# Patient Record
Sex: Female | Born: 1965 | Race: White | Hispanic: No | Marital: Single | Smoking: Current every day smoker
Health system: Southern US, Community
[De-identification: ages and names within clinical notes are randomized; demographics above are authoritative.]

## PROBLEM LIST (undated history)

## (undated) DIAGNOSIS — E7800 Pure hypercholesterolemia, unspecified: Secondary | ICD-10-CM

## (undated) DIAGNOSIS — I10 Essential (primary) hypertension: Secondary | ICD-10-CM

## (undated) DIAGNOSIS — D680 Von Willebrand's disease: Secondary | ICD-10-CM

## (undated) DIAGNOSIS — N289 Disorder of kidney and ureter, unspecified: Secondary | ICD-10-CM

## (undated) HISTORY — PX: CARPAL TUNNEL RELEASE: SHX101

## (undated) HISTORY — PX: BLADDER SURGERY: SHX569

## (undated) HISTORY — PX: ECTOPIC PREGNANCY SURGERY: SHX613

## (undated) HISTORY — PX: ABDOMINAL HYSTERECTOMY: SHX81

---

## 2005-02-22 ENCOUNTER — Ambulatory Visit: Payer: Self-pay

## 2005-09-06 ENCOUNTER — Ambulatory Visit: Payer: Self-pay | Admitting: Internal Medicine

## 2005-09-15 ENCOUNTER — Emergency Department: Payer: Self-pay | Admitting: Emergency Medicine

## 2006-03-20 ENCOUNTER — Ambulatory Visit: Payer: Self-pay | Admitting: Family Medicine

## 2007-03-29 ENCOUNTER — Ambulatory Visit: Payer: Self-pay

## 2008-04-01 ENCOUNTER — Ambulatory Visit: Payer: Self-pay | Admitting: Family Medicine

## 2009-04-07 ENCOUNTER — Ambulatory Visit: Payer: Self-pay | Admitting: Family Medicine

## 2010-04-08 ENCOUNTER — Ambulatory Visit: Payer: Self-pay | Admitting: Family Medicine

## 2010-06-08 ENCOUNTER — Ambulatory Visit: Payer: Self-pay | Admitting: Podiatry

## 2011-01-21 ENCOUNTER — Ambulatory Visit: Payer: Self-pay | Admitting: Adult Health

## 2011-08-30 ENCOUNTER — Ambulatory Visit: Payer: Self-pay

## 2012-02-13 IMAGING — [facility] SCRN MAMMO DIGITAL /CAD
1 series · 4 of 4 positions shown · non-contrast
Comparison: none

REASON FOR EXAM: SCR
COMMENTS:

[Series 265: R CC · right · 4 of 4 slices shown]
[im 1/4]
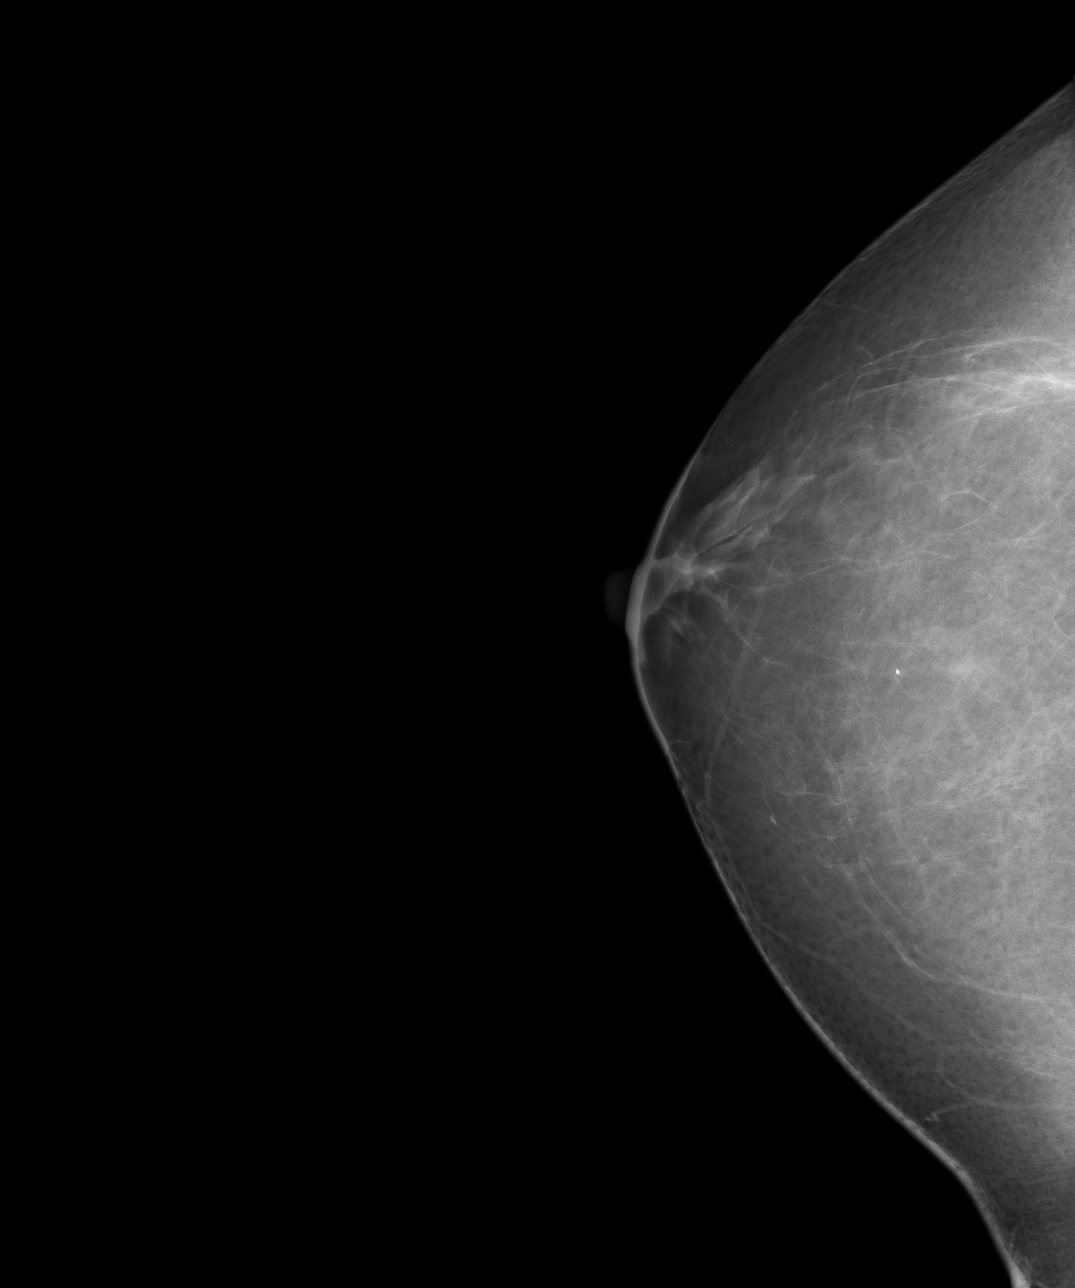
[im 2/4]
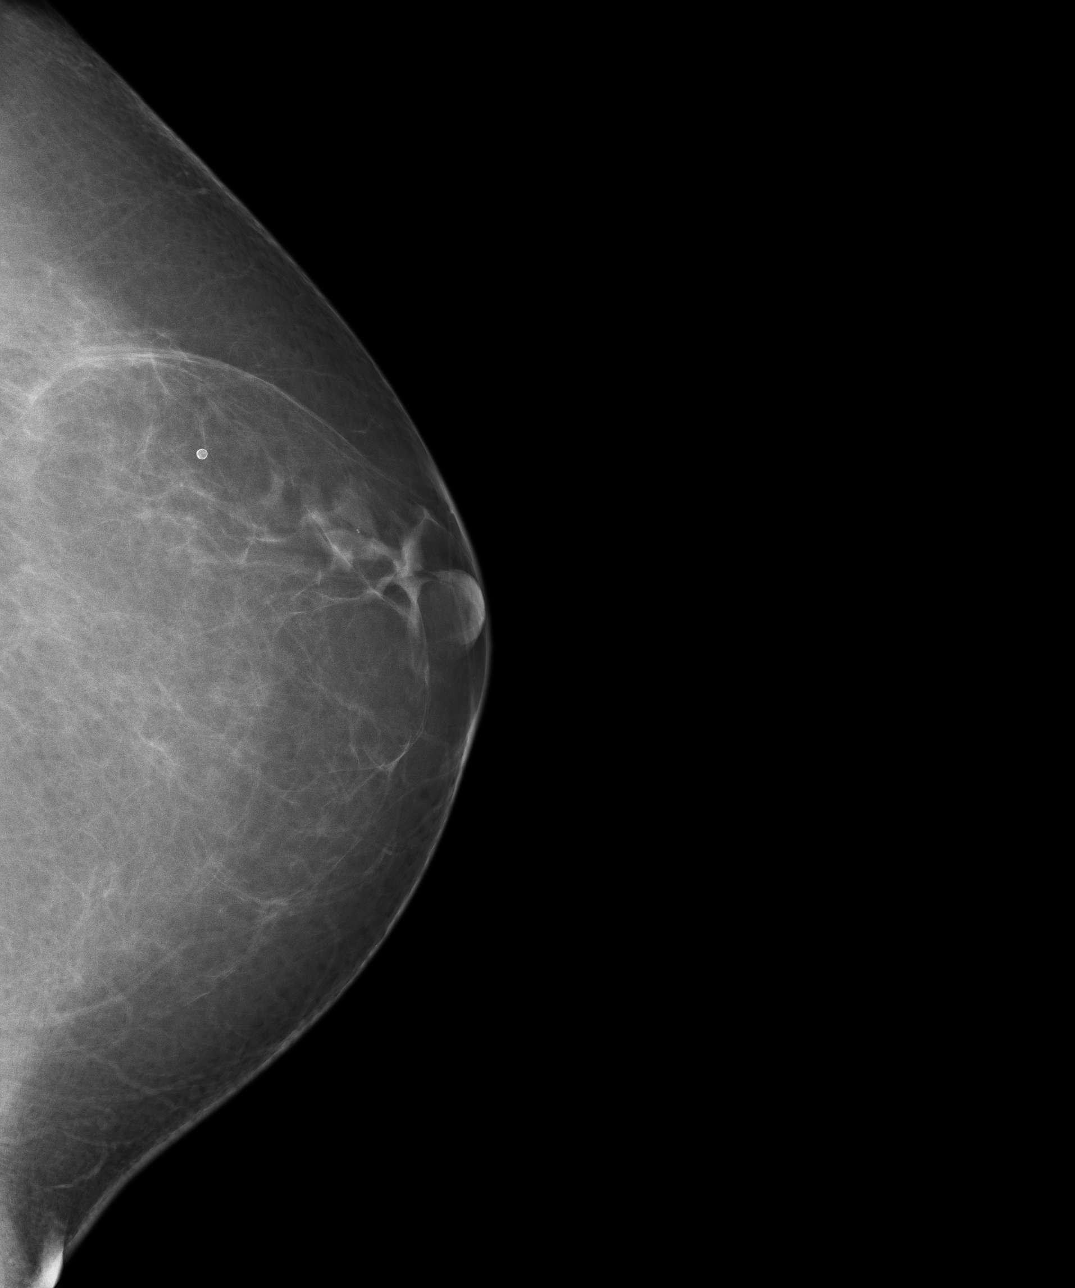
[im 3/4]
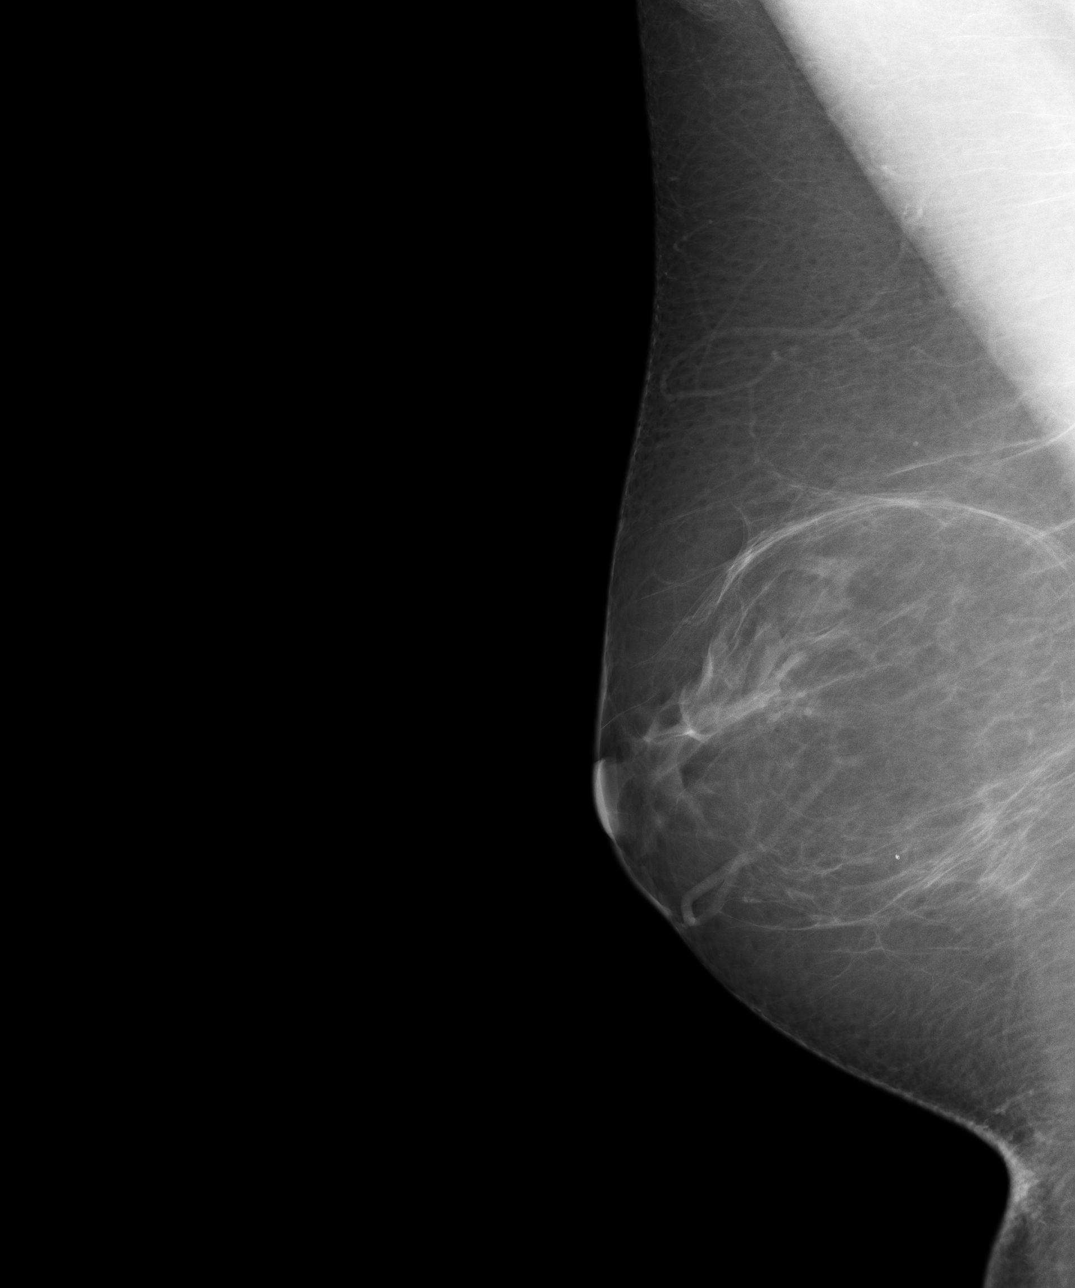
[im 4/4]
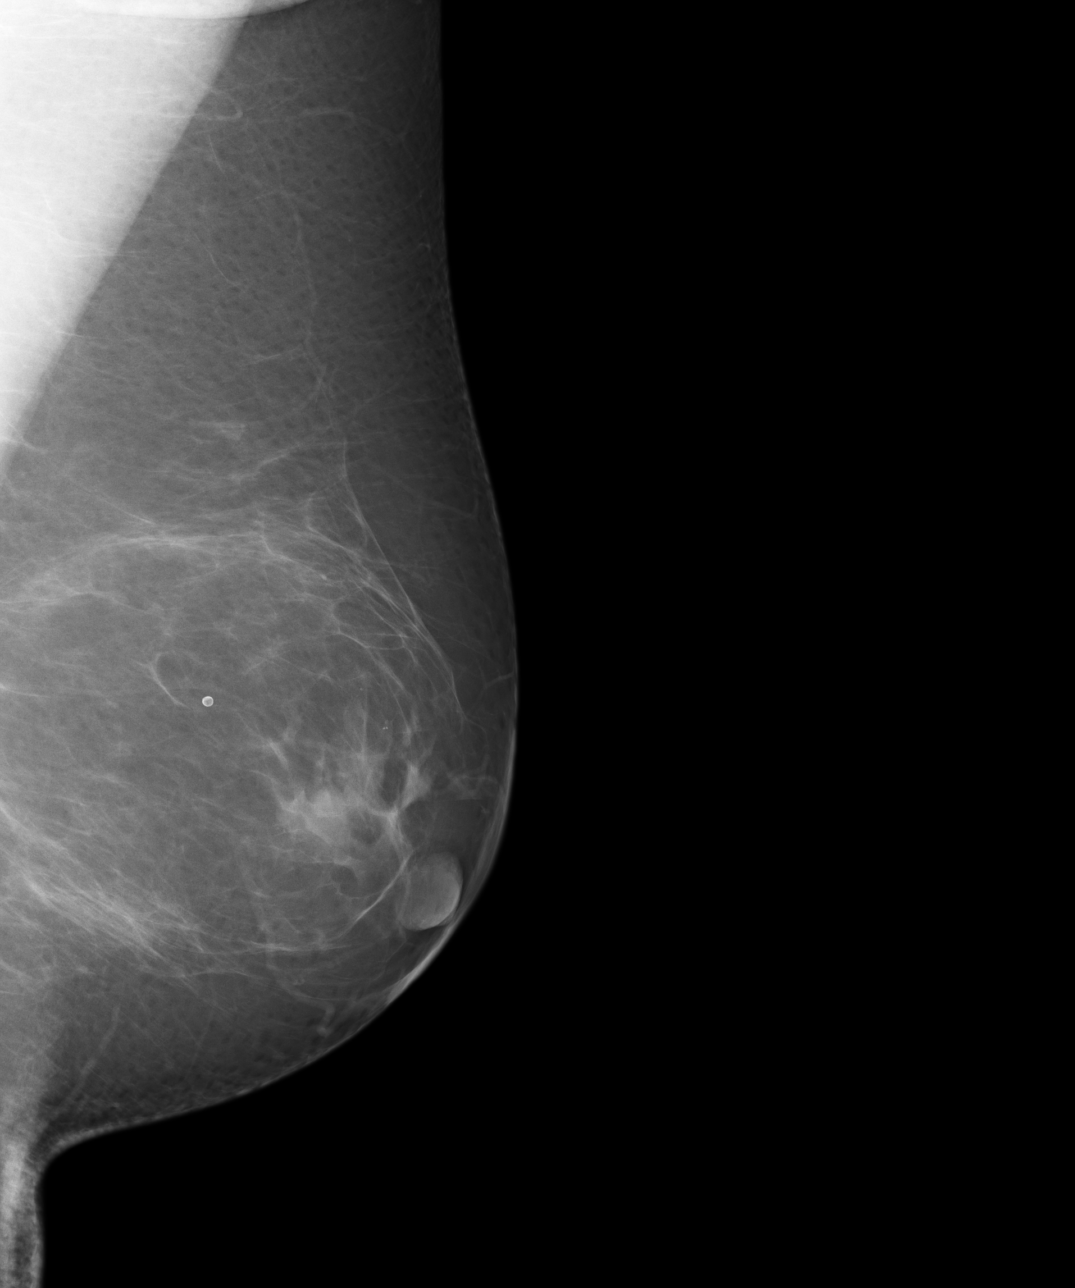

[4 of 4 positions shown; findings below may reference images not displayed]

PROCEDURE:     MAM - [facility] SCRN MAMMO DIGITAL /CAD  - April 08, 2010  [DATE]

RESULT:       Comparison is made to analog images dated 09/27/01 and to
digital exams of 04/07/09, 04/01/08 and 10/22/02.

The breasts exhibit a mild to moderately dense parenchymal pattern with some
benign calcifications bilaterally.  There is no developing density, dominant
mass or malignant calcification.
IMPRESSION: 1.     Stable benign-appearing bilateral mammogram.
2.     BI-RADS:  Category 2- Benign Finding.
3.     Please continue to encourage annual mammographic follow up.

A negative mammogram report does not preclude biopsy or other evaluation of
a clinically palpable or otherwise suspicious mass or lesion. Breast cancer
may not be detected by mammography in up to 10% of cases.

## 2013-02-27 ENCOUNTER — Ambulatory Visit: Payer: Self-pay

## 2013-03-21 ENCOUNTER — Ambulatory Visit: Payer: Self-pay

## 2015-02-21 ENCOUNTER — Ambulatory Visit: Payer: Self-pay | Admitting: Family Medicine

## 2017-10-05 ENCOUNTER — Encounter: Payer: Self-pay | Admitting: Physical Therapy

## 2017-10-05 ENCOUNTER — Ambulatory Visit: Payer: Medicaid Other | Attending: Family Medicine | Admitting: Physical Therapy

## 2017-10-05 DIAGNOSIS — M6281 Muscle weakness (generalized): Secondary | ICD-10-CM | POA: Diagnosis present

## 2017-10-05 DIAGNOSIS — G8929 Other chronic pain: Secondary | ICD-10-CM

## 2017-10-05 DIAGNOSIS — M25612 Stiffness of left shoulder, not elsewhere classified: Secondary | ICD-10-CM | POA: Insufficient documentation

## 2017-10-05 DIAGNOSIS — M25611 Stiffness of right shoulder, not elsewhere classified: Secondary | ICD-10-CM | POA: Diagnosis present

## 2017-10-05 DIAGNOSIS — M25512 Pain in left shoulder: Secondary | ICD-10-CM | POA: Diagnosis present

## 2017-10-05 DIAGNOSIS — M25511 Pain in right shoulder: Secondary | ICD-10-CM | POA: Diagnosis present

## 2017-10-05 NOTE — Therapy (Signed)
[facility] [address], [place], [address] Phone: [phone]   Fax:  [phone]  Physical Therapy Evaluation  Patient Details  Name: Thaily Hackworth MRN: [id number] Date of Birth: [birth year] Referring Provider: Dr. Marolyn Haller  Encounter Date: 10/05/2017      PT End of Session - 10/05/17 1723    Visit Number 1   Number of Visits 4   Date for PT Re-Evaluation 11/02/17   Authorization Type Medicaid   PT Start Time 1529   PT Stop Time 1619   PT Time Calculation (min) 50 min   Activity Tolerance Patient tolerated treatment well;Patient limited by pain   Behavior During Therapy [facility] for tasks assessed/performed      History reviewed. No pertinent past medical history.  History reviewed. No pertinent surgical history.  There were no vitals filed for this visit.       Subjective Assessment - 10/05/17 1525    Subjective Pt has no pain at rest today. With elevation and lifting pain gets up to 3/10 in last 24 hours.    Pertinent History Pt presents to therapy with L shoulder pain. MRI on 10/26 shows small full thickness tears of the L supraspinatus and subscapularis. MRI also identified L biceps tendinosis/tenosynovitis, subdeltoid bursitis, and AC joint OA. Left shoulder has been worse since recent fall. Pt reports that she fell in the bathroom in past onto L side on her elbow. Heavy lifting can cause pt to have pain and reduced UE function for 2-3 weeks at a time. Pt cannot reach the top of her head to perform hygiene; she props her elbows against the wall to get increased shoulder elevation. Pt is considering surgery for both shoulders.   Limitations House hold activities;Lifting   Diagnostic tests Small full-thickness anterior supraspinatus tear, left shoulder, 8 mm AP by 7 mm transverse. Small full-thickness subscapularis tear. Normal rotator cuff muscle bulk. Mild intra-articular long head of biceps tendinosis.  Bicipital tenosynovitis. Mild subdeltoid bursitis. Cystic structure anterior to the proximal humerus medial to the biceps tendon is most consistent with a ganglion cyst. Moderate acromioclavicular joint osteoarthrosis with associated subchondral bone marrow edema. There is asymmetrically increased edema in the distal clavicle suggesting the possibility of superimposed repetitive stress.     Patient Stated Goals Decreased pain, to ba able to reach up to wash my hair, reach into cabinets, etc   Currently in Pain? Yes   Pain Score 3    Pain Location Shoulder   Pain Orientation Right;Left   Pain Descriptors / Indicators Aching;Tightness   Pain Type Chronic pain   Pain Onset More than a month ago   Pain Frequency Intermittent   Aggravating Factors  Heavy Lifting, shoulder elevation   Pain Relieving Factors Rest   Effect of Pain on Daily Activities Decreased UE function for day/weeks at a time            [facility] PT Assessment - 10/05/17 0001      Assessment   Medical Diagnosis Bilateral shoulder pain   Referring Provider Dr. Marolyn Haller   Onset Date/Surgical Date --  many years   Hand Dominance Right   Next MD Visit --  None scheduled   Prior Therapy Yes following a fall about 12 years ago.     Precautions   Precautions None     Restrictions   Weight Bearing Restrictions No     Balance Screen   Has the patient fallen in the past 6 months  Yes   How many times? 1   Has the patient had a decrease in activity level because of a fear of falling?  No   Is the patient reluctant to leave their home because of a fear of falling?  No      PROM/AROM:  Upper Extremity: Shoulder: IR: R to T12, L to L5 with hand behind back. Flexion: R 91 L 93 deg AROM, R 113 L 101 deg PROM, Abduction: Same as flexion bilaterally, ER WFL bilaterally. Elbow AROM WFL bilaterally  Cervical:  Rotation R 70 deg, L 75 deg,     Lower extremity: WFL   STRENGTH:  Graded on a 0-5 scale Muscle Group Left Right   Shoulder Flex 3+ pain 4  Shoulder Abd 3+ pain 4-  Shoulder  IR/ER 4- 4-  Elbow Flex 4 4+  Elbow Ext 4- 4-  Grip 35# 24# (trigger finger)  Gross Lower Extremity [facility]  Shoulder flexion, abduction rated within pt's available range.  Sensory:  Light touch: No abnormality reported      Coordination:   No gross impairment within limited range  Observations:   Posture: Forward shoulders, elevated/abducted scapulae, slouched posture, forward head.   BALANCE: Static Standing Balance  Normal Able to maintain standing balance against maximal resistance   Good Able to maintain standing balance against moderate resistance X  Good-/Fair+ Able to maintain standing balance against minimal resistance   Fair Able to stand unsupported without UE support and without LOB for 1-2 min   Fair- Requires Min A and UE support to maintain standing without loss of balance   Poor+ Requires mod A and UE support to maintain standing without loss of balance   Poor Requires max A and UE support to maintain standing balance without loss     Standing Dynamic Balance  Normal Stand independently unsupported, able to weight shift and cross midline maximally   Good Stand independently unsupported, able to weight shift and cross midline moderately X  Good-/Fair+ Stand independently unsupported, able to weight shift across midline minimally   Fair Stand independently unsupported, weight shift, and reach ipsilaterally, loss of balance when crossing midline   Poor+ Able to stand with Min A and reach ipsilaterally, unable to weight shift   Poor+ Able to stand with Mod A and minimally reach ipsilaterally, unable to cross midline.        SPECIAL TESTS:     Hawkins-Kennedy : (+) bilaterally  Neer: Painful bilaterally, test unreliable due to pt having pain at end range  flexion  Full can: (- for pain) weak bilaterally  Resisted ER: Weak without pain  FUNCTIONAL MOBILITY:   Transfers: Pt abe to stand with  use of UE with no LOB   Gait: Pt with decreased stride length and gait speed.   OUTCOME MEASURES: TEST Outcome Interpretation  Quick Dash   58% Moderate Disability      Objective measurements completed on examination: See above findings.    Treatment:  Ther-ex: Sitting: Shoulder pulley flexion/abduction x 10 each UE is direction with 10 sec hold in pain free range Standing:  Wand flexion, abduction, Scapular retraction, upper trap stretch  instructed   All above exercises added to HEP with handout, see pt instructions  Pt already performing: Wall ladder, shoulder roll, modified pendulum,         PT Long Term Goals - 10/05/17 1747      PT LONG TERM GOAL #1   Title  Patient will decrease Quick DASH  score by > 8 points demonstrating reduced self-reported upper extremity disability.   Baseline 58%   Time 4   Period Weeks   Status New   Target Date 11/02/17     PT LONG TERM GOAL #2   Title Patient will report a worst pain of 1/10 on VAS in bilateral shoulders to improve tolerance with ADLs and reduced symptoms with lifting and hygiene activities.    Baseline 3/10   Time 4   Period Weeks   Status New   Target Date 11/02/17     PT LONG TERM GOAL #3   Title Patient will increase BUE gross strength to 4+/5 as to improve functional strength for independence with ADLs/IADLs, carrying, lifting, hygiene, and work activities.   Baseline Grossly 3+ to 4-/5 see eval 10/05/17   Time 4   Period Weeks   Status New   Target Date 11/02/17     PT LONG TERM GOAL #4   Title Pt. able to demonstrate proper lifting/ carrying mechanics with no increase in B shoulder pain to improve househould tasks.   Baseline pain limiting with lifting/ carrying tasks.    Time 4   Period Weeks   Status New   Target Date 11/02/17     PT LONG TERM GOAL #5   Title Pt will improve shoulder internal rotation AROM in order to be able to reach behind back to T3,4 in order to dress independently and  reach pockets.    Baseline IR: R to T12, L to L5   Time 4   Period Weeks   Status New   Target Date 11/02/17                Plan - 10/05/17 1724    Clinical Impression Statement 51 y/o female presents with B chronic shoulder pain with supraspinatus full thickness tear bilaterally (see MRI results). R shoulder has been painful for years, L shoulder has been worse since fall on 08/05/17. Pt assessed and found to have decreased strength in BUEs with decreased AROM in elevation consistent with supraspinatus tear. Pt has moderate UE disability based on Quick Dash (58%). HEP initiated. Pt is seriously considering surgery for R shoulder, and possible for L shoulder after. Pt educated on benefits of PT with or without surgical option.    History and Personal Factors relevant to plan of care: (+) age (-) chronic injury, full tear   Clinical Presentation Evolving   Clinical Presentation due to: Pain/disfunction worsening    Clinical Decision Making Moderate   Rehab Potential Fair   Clinical Impairments Affecting Rehab Potential Full thickness RC tear, chronic postural deficit   PT Frequency 2x / week   PT Duration 4 weeks   PT Treatment/Interventions ADLs/Self Care Home Management;Aquatic Therapy;Biofeedback;Cryotherapy;Moist Heat;Therapeutic activities;Therapeutic exercise;Neuromuscular re-education;Manual techniques;Patient/family education;Passive range of motion;Dry needling;Energy conservation;Taping   PT Next Visit Plan Further assess cervical AROM, STM to upper traps, scap retraction, postural education, re-assess HEP   PT Home Exercise Plan Pt already performing: Wall ladder, shoulder roll, modified pendulum; shoulder pulley flexion/abduction, wand flexion, abduction, scapular retraction, upper trap stretch   Consulted and Agree with Plan of Care Patient      Patient will benefit from skilled therapeutic intervention in order to improve the following deficits and impairments:  Decreased  activity tolerance, Decreased range of motion, Decreased strength, Hypomobility, Increased fascial restricitons, Impaired flexibility, Impaired perceived functional ability, Increased muscle spasms, Impaired tone, Impaired UE functional use, Postural dysfunction, Improper body mechanics, Pain  Visit  Diagnosis: Chronic left shoulder pain  Chronic right shoulder pain  Stiffness of left shoulder, not elsewhere classified  Stiffness of right shoulder, not elsewhere classified  Muscle weakness (generalized)     Problem List There are no active problems to display for this patient.  Cassell Smiles, SPT Cammie Mcgee, PT, DPT # [id number] 10/06/2017, 7:51 AM  [facility] [address] [place], [place], [address] Phone: [phone]   Fax:  [phone]  Name: Gloriann Riede MRN: [id number] Date of Birth: 10-02-1966

## 2017-10-06 ENCOUNTER — Encounter: Payer: Self-pay | Admitting: Physical Therapy

## 2017-10-12 ENCOUNTER — Encounter: Payer: Self-pay | Admitting: Physical Therapy

## 2017-10-12 ENCOUNTER — Ambulatory Visit: Payer: Medicaid Other | Admitting: Physical Therapy

## 2017-10-12 DIAGNOSIS — M25512 Pain in left shoulder: Secondary | ICD-10-CM | POA: Diagnosis not present

## 2017-10-12 DIAGNOSIS — G8929 Other chronic pain: Secondary | ICD-10-CM

## 2017-10-12 DIAGNOSIS — M25511 Pain in right shoulder: Secondary | ICD-10-CM

## 2017-10-12 DIAGNOSIS — M6281 Muscle weakness (generalized): Secondary | ICD-10-CM

## 2017-10-12 DIAGNOSIS — M25612 Stiffness of left shoulder, not elsewhere classified: Secondary | ICD-10-CM

## 2017-10-12 DIAGNOSIS — M25611 Stiffness of right shoulder, not elsewhere classified: Secondary | ICD-10-CM

## 2017-10-12 NOTE — Therapy (Signed)
[place] [facility] [address] [place], [place], [address] Phone: [phone]   Fax:  [phone]  Physical Therapy Treatment  Patient Details  Name: Beth Ayala MRN: [id number] Date of Birth: [birth year] Referring Provider: Dr. Marolyn HallerHeffington   Encounter Date: 10/12/2017  PT End of Session - 10/12/17 1748    Visit Number  2    Number of Visits  4    Date for PT Re-Evaluation  11/02/17    Authorization Type  Medicaid    PT Start Time  1544    PT Stop Time  1633    PT Time Calculation (min)  49 min    Activity Tolerance  Patient tolerated treatment well;No increased pain    Behavior During Therapy  [facility] for tasks assessed/performed       History reviewed. No pertinent past medical history.  History reviewed. No pertinent surgical history.  There were no vitals filed for this visit.  Subjective Assessment - 10/12/17 1544    Subjective  Pt has no pain today; she is performing HEP everyday without any reported issues.     Pertinent History  Pt presents to therapy with L shoulder pain. MRI on 10/26 shows small full thickness tears of the L supraspinatus and subscapularis. MRI also identified L biceps tendinosis/tenosynovitis, subdeltoid bursitis, and AC joint OA. Left shoulder has been worse since recent fall. Pt reports that she fell in the bathroom in past onto L side on her elbow. Heavy lifting can cause pt to have pain and reduced UE function for 2-3 weeks at a time. Pt cannot reach the top of her head to perform hygiene; she props her elbows against the wall to get increased shoulder elevation. Pt is considering surgery for both shoulders.    Limitations  House hold activities;Lifting    Diagnostic tests  Small full-thickness anterior supraspinatus tear, left shoulder, 8 mm AP by 7 mm transverse. Small full-thickness subscapularis tear. Normal rotator cuff muscle bulk. Mild intra-articular long head of biceps tendinosis. Bicipital  tenosynovitis. Mild subdeltoid bursitis. Cystic structure anterior to the proximal humerus medial to the biceps tendon is most consistent with a ganglion cyst. Moderate acromioclavicular joint osteoarthrosis with associated subchondral bone marrow edema. There is asymmetrically increased edema in the distal clavicle suggesting the possibility of superimposed repetitive stress.      Patient Stated Goals  Decreased pain, to ba able to reach up to wash my hair, reach into cabinets, etc    Currently in Pain?  No/denies    Pain Onset  More than a month ago         Treatment:  Ther-ex: Sitting:    Shoulder pulley flexion/abduction x 10 each UE in each direction with 10 sec hold in       pain free range Standing:    Doorway pec stretch 2 x 30 sec    Row with GTB using handle attachment x 20 with 3 sec hold (no pain)   Instructed pt in tendon gliding exercise series for carpal tunnel with handout     Cervical retraction and upper trap stretch instructed and added to HEP; pt able to     perform with good technique      Manual therapy:     STM to upper traps, cervical paraspinals, and intrascapular musculature x 8-10 min      PT Education - 10/12/17 1643    Education provided  Yes    Education Details  HEP advanced  Person(s) Educated  Patient    Methods  Demonstration;Explanation;Tactile cues;Verbal cues;Handout    Comprehension  Verbal cues required;Returned demonstration;Verbalized understanding;Tactile cues required          PT Long Term Goals - 10/05/17 1747      PT LONG TERM GOAL #1   Title   Patient will decrease Quick DASH score by > 8 points demonstrating reduced self-reported upper extremity disability.    Baseline  58%    Time  4    Period  Weeks    Status  New    Target Date  11/02/17      PT LONG TERM GOAL #2   Title  Patient will report a worst pain of 1/10 on VAS in bilateral shoulders to improve tolerance with ADLs and reduced symptoms with lifting and hygiene  activities.     Baseline  3/10    Time  4    Period  Weeks    Status  New    Target Date  11/02/17      PT LONG TERM GOAL #3   Title  Patient will increase BUE gross strength to 4+/5 as to improve functional strength for independence with ADLs/IADLs, carrying, lifting, hygiene, and work activities.    Baseline  Grossly 3+ to 4-/5 see eval 10/05/17    Time  4    Period  Weeks    Status  New    Target Date  11/02/17      PT LONG TERM GOAL #4   Title  Pt. able to demonstrate proper lifting/ carrying mechanics with no increase in B shoulder pain to improve househould tasks.    Baseline  pain limiting with lifting/ carrying tasks.     Time  4    Period  Weeks    Status  New    Target Date  11/02/17      PT LONG TERM GOAL #5   Title  Pt will improve shoulder internal rotation AROM in order to be able to reach behind back to T3,4 in order to dress independently and reach pockets.     Baseline  IR: R to T12, L to L5    Time  4    Period  Weeks    Status  New    Target Date  11/02/17            Plan - 10/12/17 1755    Clinical Impression Statement  Pt returns to therapy with no resting symptoms. She was able to perform her HEP with good technique requiring min cueing. HEP updated. Pt continues to have pain in R hand with possible trigger finger in 4th digit on R hand and weakness in bilateral hands (pt reports this is due to carpal tunnel bilaterally, which she has been treated for in the past).    Clinical Presentation  Evolving    Clinical Decision Making  Moderate    Rehab Potential  Fair    Clinical Impairments Affecting Rehab Potential  Full thickness RC tear, chronic postural deficit    PT Frequency  2x / week    PT Duration  4 weeks    PT Treatment/Interventions  ADLs/Self Care Home Management;Aquatic Therapy;Biofeedback;Cryotherapy;Moist Heat;Therapeutic activities;Therapeutic exercise;Neuromuscular re-education;Manual techniques;Patient/family education;Passive range of  motion;Dry needling;Energy conservation;Taping    PT Next Visit Plan  Further assess cervical AROM, STM to upper traps, Rotator cuff strengthening/stretching    PT Home Exercise Plan  Scapular retraction GTB, cervical retraction Wall ladder, shoulder roll, modified pendulum; shoulder  pulley flexion/abduction, wand flexion, abduction, scapular retraction, upper trap stretch    Consulted and Agree with Plan of Care  Patient       Patient will benefit from skilled therapeutic intervention in order to improve the following deficits and impairments:  Decreased activity tolerance, Decreased range of motion, Decreased strength, Hypomobility, Increased fascial restricitons, Impaired flexibility, Impaired perceived functional ability, Increased muscle spasms, Impaired tone, Impaired UE functional use, Postural dysfunction, Improper body mechanics, Pain  Visit Diagnosis: Chronic left shoulder pain  Chronic right shoulder pain  Stiffness of left shoulder, not elsewhere classified  Stiffness of right shoulder, not elsewhere classified  Muscle weakness (generalized)     Problem List There are no active problems to display for this patient.  Addysin Porco Clydene Laming, SPT Cammie Mcgee, PT, DPT # [id number] 10/13/2017, 2:23 PM  [facility] [address] [place], [place], [address] Phone: [phone]   Fax:  [phone]  Name: Beth Ayala MRN: [id number] Date of Birth: 08/15/1966

## 2017-10-19 ENCOUNTER — Ambulatory Visit: Payer: Medicaid Other | Admitting: Physical Therapy

## 2017-10-19 ENCOUNTER — Encounter: Payer: Self-pay | Admitting: Physical Therapy

## 2017-10-19 DIAGNOSIS — M25611 Stiffness of right shoulder, not elsewhere classified: Secondary | ICD-10-CM

## 2017-10-19 DIAGNOSIS — G8929 Other chronic pain: Secondary | ICD-10-CM

## 2017-10-19 DIAGNOSIS — M25612 Stiffness of left shoulder, not elsewhere classified: Secondary | ICD-10-CM

## 2017-10-19 DIAGNOSIS — M6281 Muscle weakness (generalized): Secondary | ICD-10-CM

## 2017-10-19 DIAGNOSIS — M25511 Pain in right shoulder: Secondary | ICD-10-CM

## 2017-10-19 DIAGNOSIS — M25512 Pain in left shoulder: Principal | ICD-10-CM

## 2017-10-19 NOTE — Therapy (Signed)
[facility] [address] [place], [place], [address] Phone: [phone]   Fax:  [phone]  Physical Therapy Treatment  Patient Details  Name: Beth Ayala MRN: [id number] Date of Birth: Sep 15, 1966 Referring Provider: Dr. Marolyn HallerHeffington   Encounter Date: 10/19/2017  PT End of Session - 10/19/17 1739    Visit Number  3    Number of Visits  4    Date for PT Re-Evaluation  11/02/17    Authorization Type  Medicaid    PT Start Time  1536    PT Stop Time  1640    PT Time Calculation (min)  64 min    Activity Tolerance  Patient tolerated treatment well;No increased pain    Behavior During Therapy  [facility] for tasks assessed/performed       History reviewed. No pertinent past medical history.  History reviewed. No pertinent surgical history.  There were no vitals filed for this visit.  Subjective Assessment - 10/19/17 1541    Subjective  Pt reports she has had some L lateral neck soreness after performing stretches. No pain currently    Pertinent History  Pt presents to therapy with L shoulder pain. MRI on 10/26 shows small full thickness tears of the L supraspinatus and subscapularis. MRI also identified L biceps tendinosis/tenosynovitis, subdeltoid bursitis, and AC joint OA. Left shoulder has been worse since recent fall. Pt reports that she fell in the bathroom in past onto L side on her elbow. Heavy lifting can cause pt to have pain and reduced UE function for 2-3 weeks at a time. Pt cannot reach the top of her head to perform hygiene; she props her elbows against the wall to get increased shoulder elevation. Pt is considering surgery for both shoulders.    Limitations  House hold activities;Lifting    Diagnostic tests  Small full-thickness anterior supraspinatus tear, left shoulder, 8 mm AP by 7 mm transverse. Small full-thickness subscapularis tear. Normal rotator cuff muscle bulk. Mild intra-articular long head of biceps  tendinosis. Bicipital tenosynovitis. Mild subdeltoid bursitis. Cystic structure anterior to the proximal humerus medial to the biceps tendon is most consistent with a ganglion cyst. Moderate acromioclavicular joint osteoarthrosis with associated subchondral bone marrow edema. There is asymmetrically increased edema in the distal clavicle suggesting the possibility of superimposed repetitive stress.      Patient Stated Goals  Decreased pain, to ba able to reach up to wash my hair, reach into cabinets, etc    Currently in Pain?  No/denies    Pain Onset  More than a month ago       Treatment:  Warm-up on UE bike x 3 min each direction (un-billed)  Ther-ex: Sitting:        Lat pull down resisted with GTB x 20 reps with active assisted shoulder       abduction stretch (adapted with t-band handles due to limited grip strength) Standing:   Shoulder IR/ER resisted with YTB x 20 each with each UE (added to HEP)           Doorway pec stretch 2 x 30 sec    Instructed pt in corner pec stretch 2 x 30 sec    Shoulder stretch at // bars with straight arm (eccentric shoulder extension     Stretch) 4 x 30 sec Manual therapy: Supine:          Cervical stretches for upper trap, levator, and scalenes using muscle energy     technique  for contract-relax stretch, 5 sec contract and 20 sec relax x 4      iterations each side   Re-instructed cervical retraction     Cervical distraction x 4-6 min throughout manual therapy   Suboccipital release x 3-4 min   HEP updated with lat pull down with GTB, B shoulder ER with YTB, shoulder IR with YTB.      PT Long Term Goals - 10/05/17 1747      PT LONG TERM GOAL #1   Title   Patient will decrease Quick DASH score by > 8 points demonstrating reduced self-reported upper extremity disability.    Baseline  58%    Time  4    Period  Weeks    Status  New    Target Date  11/02/17      PT LONG TERM GOAL #2   Title  Patient will report a worst pain of 1/10 on VAS  in bilateral shoulders to improve tolerance with ADLs and reduced symptoms with lifting and hygiene activities.     Baseline  3/10    Time  4    Period  Weeks    Status  New    Target Date  11/02/17      PT LONG TERM GOAL #3   Title  Patient will increase BUE gross strength to 4+/5 as to improve functional strength for independence with ADLs/IADLs, carrying, lifting, hygiene, and work activities.    Baseline  Grossly 3+ to 4-/5 see eval 10/05/17    Time  4    Period  Weeks    Status  New    Target Date  11/02/17      PT LONG TERM GOAL #4   Title  Pt. able to demonstrate proper lifting/ carrying mechanics with no increase in B shoulder pain to improve househould tasks.    Baseline  pain limiting with lifting/ carrying tasks.     Time  4    Period  Weeks    Status  New    Target Date  11/02/17      PT LONG TERM GOAL #5   Title  Pt will improve shoulder internal rotation AROM in order to be able to reach behind back to T3,4 in order to dress independently and reach pockets.     Baseline  IR: R to T12, L to L5    Time  4    Period  Weeks    Status  New    Target Date  11/02/17            Plan - 10/19/17 1750    Clinical Impression Statement  Pt had increased pain-free shoulder AROM today. She is able to abduct shoulders passively to >140deg without pain eccentrically with T-band resited lat pull-downs. Pt is adherent to HEP and has good carryover with technique. Consider D/C next session with instructions to contact PT if she has any questions or change in status    Clinical Presentation  Evolving    Clinical Decision Making  Moderate    Rehab Potential  Fair    Clinical Impairments Affecting Rehab Potential  Full thickness RC tear, chronic postural deficit    PT Frequency  2x / week    PT Duration  4 weeks    PT Treatment/Interventions  ADLs/Self Care Home Management;Aquatic Therapy;Biofeedback;Cryotherapy;Moist Heat;Therapeutic activities;Therapeutic exercise;Neuromuscular  re-education;Manual techniques;Patient/family education;Passive range of motion;Dry needling;Energy conservation;Taping    PT Next Visit Plan  Cervical stretching, RC stretching/strengthening, shoulder PROM, advance  ther-ex, Nautilus overhead eccentrics     PT Home Exercise Plan   lat pull down with GTB, B shoulder ER with YTB, shoulder IR with YTB. Scapular retraction GTB, cervical retraction Wall ladder, shoulder roll, modified pendulum; shoulder pulley flexion/abduction, wand flexion, abduction, scapular retraction, upper trap stretch    Consulted and Agree with Plan of Care  Patient       Patient will benefit from skilled therapeutic intervention in order to improve the following deficits and impairments:  Decreased activity tolerance, Decreased range of motion, Decreased strength, Hypomobility, Increased fascial restricitons, Impaired flexibility, Impaired perceived functional ability, Increased muscle spasms, Impaired tone, Impaired UE functional use, Postural dysfunction, Improper body mechanics, Pain  Visit Diagnosis: Chronic left shoulder pain  Chronic right shoulder pain  Stiffness of left shoulder, not elsewhere classified  Stiffness of right shoulder, not elsewhere classified  Muscle weakness (generalized)     Problem List There are no active problems to display for this patient.  Swannie Milius Clydene Laming, SPT Cammie Mcgee, PT, DPT # [id number] 10/20/2017, 6:22 AM  [facility] [address] [place], [place], [address] Phone: [phone]   Fax:  [phone]  Name: Beth Ayala MRN: [id number] Date of Birth: 25-Feb-1966

## 2017-10-25 ENCOUNTER — Encounter: Payer: Medicaid Other | Admitting: Physical Therapy

## 2017-10-31 ENCOUNTER — Ambulatory Visit: Payer: Medicaid Other | Admitting: Physical Therapy

## 2017-11-01 ENCOUNTER — Ambulatory Visit: Payer: Medicaid Other | Admitting: Physical Therapy

## 2017-11-01 ENCOUNTER — Encounter: Payer: Self-pay | Admitting: Physical Therapy

## 2017-11-01 DIAGNOSIS — M6281 Muscle weakness (generalized): Secondary | ICD-10-CM

## 2017-11-01 DIAGNOSIS — M25511 Pain in right shoulder: Secondary | ICD-10-CM

## 2017-11-01 DIAGNOSIS — G8929 Other chronic pain: Secondary | ICD-10-CM

## 2017-11-01 DIAGNOSIS — M25512 Pain in left shoulder: Secondary | ICD-10-CM | POA: Diagnosis not present

## 2017-11-01 DIAGNOSIS — M25611 Stiffness of right shoulder, not elsewhere classified: Secondary | ICD-10-CM

## 2017-11-01 DIAGNOSIS — M25612 Stiffness of left shoulder, not elsewhere classified: Secondary | ICD-10-CM

## 2017-11-01 NOTE — Therapy (Addendum)
[place] [address] [place], [place], [address] Phone: [phone]   Fax:  [phone]  Physical Therapy Treatment  Patient Details  Name: Beth Ayala MRN: [id number] Date of Birth: 1966/06/26 Referring Provider: Dr. Guadelupe Sabin   Encounter Date: 11/01/2017  PT End of Session - 11/01/17 1620    Visit Number  4    Number of Visits  4    Date for PT Re-Evaluation  11/02/17    Authorization Type  Medicaid    PT Start Time  8251    PT Stop Time  1602    PT Time Calculation (min)  46 min    Activity Tolerance  Patient tolerated treatment well;No increased pain    Behavior During Therapy  [facility] for tasks assessed/performed       History reviewed. No pertinent past medical history.  History reviewed. No pertinent surgical history.  There were no vitals filed for this visit.  Subjective Assessment - 11/01/17 1618    Subjective  Pt is doing well. She is performing HEP and independently managing progression to limit muscle soreness. Pt reports no pain at this time.     Pertinent History  Pt presents to therapy with L shoulder pain. MRI on 10/26 shows small full thickness tears of the L supraspinatus and subscapularis. MRI also identified L biceps tendinosis/tenosynovitis, subdeltoid bursitis, and AC joint OA. Left shoulder has been worse since recent fall. Pt reports that she fell in the bathroom in past onto L side on her elbow. Heavy lifting can cause pt to have pain and reduced UE function for 2-3 weeks at a time. Pt cannot reach the top of her head to perform hygiene; she props her elbows against the wall to get increased shoulder elevation. Pt is considering surgery for both shoulders.    Limitations  House hold activities;Lifting    Diagnostic tests  Small full-thickness anterior supraspinatus tear, left shoulder, 8 mm AP by 7 mm transverse. Small full-thickness subscapularis tear. Normal rotator cuff muscle bulk. Mild  intra-articular long head of biceps tendinosis. Bicipital tenosynovitis. Mild subdeltoid bursitis. Cystic structure anterior to the proximal humerus medial to the biceps tendon is most consistent with a ganglion cyst. Moderate acromioclavicular joint osteoarthrosis with associated subchondral bone marrow edema. There is asymmetrically increased edema in the distal clavicle suggesting the possibility of superimposed repetitive stress.      Patient Stated Goals  Decreased pain, to ba able to reach up to wash my hair, reach into cabinets, etc    Currently in Pain?  No/denies    Pain Onset  More than a month ago         Cervical stretching, RC stretching/strengthening, shoulder PROM, advance ther-ex, Nautilus overhead eccentrics   Treatment:   Goals Re-assessed:  Quick DASH: 31/55 (45% disability)  Previously 37/55 (58% disability) on 11/1  Pain: Previously worst pain was 3/10 on 11/1 Lifting: Good technique and no pain with lifting/carrying 10# box  Shoulder IR: No change from previous (Shoulder IR: R to T12, L to L5 on 11/1) Strength: 10/31/17  STRENGTH:  Graded on a 0-5 scale Muscle Group Left Right  Shoulder Flex 4- 4  Shoulder Abd 3+ pain 4  Shoulder  IR/ER 4 4+  Elbow Flex 4+ 4+  Elbow Ext 5 5  Grip 29# 24#  Gross Lower Extremity [facility]  Shoulder flexion, abduction rated within pt's available range.  10/05/17 STRENGTH:  Graded on a 0-5 scale Muscle Group Left Right  Shoulder Flex 3+ pain 4  Shoulder Abd 3+ pain 4-  Shoulder  IR/ER 4- 4-  Elbow Flex 4 4+  Elbow Ext 4- 4-  Grip 35# 24# (trigger finger)  Gross Lower Extremity [facility]  Shoulder flexion, abduction rated within pt's available range.      Ther-ex: Standing Nautilus:  20# Lat pull-down 2  X 20 reps (one set with bar, one with handles)  20# Triceps extensions 2 x 20 reps Sitting:  20# row 1 x 20 reps Min cues for technique and isolation of movement   HEP updated with BTB for lat pull down; discussed  continuing all other HEP as directed following D/C     PT Long Term Goals - 11/01/17 1550      PT LONG TERM GOAL #1   Title   Patient will decrease Quick DASH score by > 8 points demonstrating reduced self-reported upper extremity disability.    Baseline  31/55 (45% disability)  11/28    Time  4    Period  Weeks    Status  Achieved    Target Date  11/02/17      PT LONG TERM GOAL #2   Title  Patient will report a worst pain of 1/10 on VAS in bilateral shoulders to improve tolerance with ADLs and reduced symptoms with lifting and hygiene activities.     Baseline  3/10 on 11/1    2/10 on 11/28    Time  4    Period  Weeks    Status  Achieved    Target Date  11/02/17      PT LONG TERM GOAL #3   Title  Patient will increase BUE gross strength to 4+/5 as to improve functional strength for independence with ADLs/IADLs, carrying, lifting, hygiene, and work activities.    Baseline  Grossly 3+ to 4-/5 see eval 10/05/17    all improved about 1/2 muscle grade    Time  4    Period  Weeks    Status  Partially Met    Target Date  11/02/17      PT LONG TERM GOAL #4   Title  Pt. able to demonstrate proper lifting/ carrying mechanics with no increase in B shoulder pain to improve househould tasks.    Baseline  Good technique and no pain with lifting/carrying 10# box     Time  4    Period  Weeks    Status  Achieved    Target Date  11/02/17      PT LONG TERM GOAL #5   Title  Pt will improve shoulder internal rotation AROM in order to be able to reach behind back to T3,4 in order to dress independently and reach pockets.     Baseline  IR: R to T12, L to L5 on 11/1      No change on 11/28    Time  4    Period  Weeks    Status  Partially Met    Target Date  11/02/17        Plan - 11/01/17 1620    Clinical Impression:         Clinical Presentation  Pt re-assessed on goals today with good progress. Quick Deliah Boston is improved from 45% disability to 37% disability, strength is improved in  almost all tested muscle groups by about 1/2 muscle grade. Pt is having less pain with functional UE tasks, though she remains limited in shoulder elevation due to full thickness  tear in rotator cuff. Pt is independent with HEP and is able to continue progressing towards remaining goals independently. Pt is appropriate for D/C at this time.  Evolving    Clinical Decision Making  Moderate    Rehab Potential  Fair    Clinical Impairments Affecting Rehab Potential  Full thickness RC tear, chronic postural deficit    PT Frequency  2x / week    PT Duration  4 weeks    PT Treatment/Interventions  ADLs/Self Care Home Management;Aquatic Therapy;Biofeedback;Cryotherapy;Moist Heat;Therapeutic activities;Therapeutic exercise;Neuromuscular re-education;Manual techniques;Patient/family education;Passive range of motion;Dry needling;Energy conservation;Taping    PT Next Visit Plan  D/C    PT Home Exercise Plan  Continued  lat pull down with BTB, B shoulder ER with YTB, shoulder IR with YTB. Scapular retraction GTB, cervical retraction Wall ladder, shoulder roll, modified pendulum; shoulder pulley flexion/abduction, wand flexion, abduction, scapular retraction, upper trap stretch    Consulted and Agree with Plan of Care  Patient       Patient will benefit from skilled therapeutic intervention in order to improve the following deficits and impairments:  Decreased activity tolerance, Decreased range of motion, Decreased strength, Hypomobility, Increased fascial restricitons, Impaired flexibility, Impaired perceived functional ability, Increased muscle spasms, Impaired tone, Impaired UE functional use, Postural dysfunction, Improper body mechanics, Pain  Visit Diagnosis: Chronic left shoulder pain  Chronic right shoulder pain  Stiffness of left shoulder, not elsewhere classified  Stiffness of right shoulder, not elsewhere classified  Muscle weakness (generalized)     Problem List There are no active  problems to display for this patient.  Burnett Corrente, SPT Pura Spice, PT, DPT # [phone] 11/02/2017, 8:36 AM  [facility] [address] [place], [place], [address] Phone: [phone]   Fax:  [phone]  Name: Beth Ayala MRN: [id number] Date of Birth: Jun 30, 1966

## 2017-11-02 ENCOUNTER — Encounter: Payer: Self-pay | Admitting: Physical Therapy

## 2018-05-01 ENCOUNTER — Other Ambulatory Visit: Payer: Self-pay

## 2018-05-01 ENCOUNTER — Ambulatory Visit
Admission: EM | Admit: 2018-05-01 | Discharge: 2018-05-01 | Disposition: A | Discharge status: Home / Self Care | Payer: Medicaid Other | Attending: Emergency Medicine | Admitting: Emergency Medicine

## 2018-05-01 DIAGNOSIS — W57XXXA Bitten or stung by nonvenomous insect and other nonvenomous arthropods, initial encounter: Secondary | ICD-10-CM | POA: Diagnosis not present

## 2018-05-01 DIAGNOSIS — S70361A Insect bite (nonvenomous), right thigh, initial encounter: Secondary | ICD-10-CM

## 2018-05-01 HISTORY — DX: Pure hypercholesterolemia, unspecified: E78.00

## 2018-05-01 HISTORY — DX: Essential (primary) hypertension: I10

## 2018-05-01 HISTORY — DX: Von Willebrand's disease: D68.0

## 2018-05-01 MED ORDER — MUPIROCIN 2 % EX OINT: 1.0000 "application " | TOPICAL_OINTMENT | Freq: Three times a day (TID) | CUTANEOUS | 0 refills | Status: AC

## 2018-05-01 MED ORDER — DOXYCYCLINE HYCLATE 100 MG PO CAPS: 100.0000 mg | ORAL_CAPSULE | Freq: Two times a day (BID) | ORAL | 0 refills | Status: DC

## 2018-05-01 NOTE — ED Triage Notes (Signed)
Pt states on Saturday night she awoke and was itching in two areas of her thigh. Then skin turned red around it. Then the areas were "leaking". Pt has been using Neosporin and now areas are scabbed. Sore to touch

## 2018-05-01 NOTE — ED Provider Notes (Signed)
[facility]    CSN: [id number] Arrival date & time: 05/01/18  1201     History   Chief Complaint Chief Complaint  Patient presents with  . Insect Bite    HPI Beth Ayala is a 52 y.o. female.   HPI  52 year old female presents with an area on her right thigh awoke her on Saturday night with itching redness and discharge.  1 of the sores is over the upper medial thigh and the other over the right posterior thigh.  Been using Neosporin; noticed scabbing over the area  now.  Is tender to touch.  She has had no fever or chills.  She states that she had been assisting her brother who was paralyzed in a recent motor vehicle accident.  They were rearranging his house and using a pod for storage.  She thinks that she may have incurred the insect bite at that time.        Past Medical History:  Diagnosis Date  . High cholesterol   . Hypertension   . Von Willebrand disease (HCC)     There are no active problems to display for this patient.   Past Surgical History:  Procedure Laterality Date  . ABDOMINAL HYSTERECTOMY    . BLADDER SURGERY    . CARPAL TUNNEL RELEASE    . ECTOPIC PREGNANCY SURGERY      OB History   None      Home Medications    Prior to Admission medications   Medication Sig Start Date End Date Taking? Authorizing Provider  doxycycline (VIBRAMYCIN) 100 MG capsule Take 1 capsule (100 mg total) by mouth 2 (two) times daily. 05/01/18   Lutricia Feil, PA-C  mupirocin ointment (BACTROBAN) 2 % Apply 1 application topically 3 (three) times daily. 05/01/18   Lutricia Feil, PA-C    Family History History reviewed. No pertinent family history.  Social History Social History   Tobacco Use  . Smoking status: Current Every Day Smoker    Packs/day: 0.50    Types: Cigarettes  . Smokeless tobacco: Never Used  Substance Use Topics  . Alcohol use: Never    Frequency: Never  . Drug use: Never     Allergies   Aspirin and  Nsaids   Review of Systems Review of Systems  Constitutional: Positive for activity change. Negative for appetite change, chills, fatigue and fever.  Skin: Positive for color change and rash.  All other systems reviewed and are negative.    Physical Exam Triage Vital Signs ED Triage Vitals  Enc Vitals Group     BP 05/01/18 1214 (!) 148/76     Pulse Rate 05/01/18 1214 83     Resp 05/01/18 1214 20     Temp 05/01/18 1214 98.7 F (37.1 C)     Temp Source 05/01/18 1214 Oral     SpO2 05/01/18 1214 99 %     Weight 05/01/18 1215 178 lb (80.7 kg)     Height 05/01/18 1215 5' (1.524 m)     Head Circumference --      Peak Flow --      Pain Score 05/01/18 1215 2     Pain Loc --      Pain Edu? --      Excl. in GC? --    No data found.  Updated Vital Signs BP (!) 148/76 (BP Location: Left Arm)   Pulse 83   Temp 98.7 F (37.1 C) (Oral)   Resp 20  Ht 5' (1.524 m)   Wt 178 lb (80.7 kg)   SpO2 99%   BMI 34.76 kg/m   Visual Acuity Right Eye Distance:   Left Eye Distance:   Bilateral Distance:    Right Eye Near:   Left Eye Near:    Bilateral Near:     Physical Exam  Constitutional: She is oriented to person, place, and time. She appears well-developed and well-nourished. No distress.  HENT:  Head: Normocephalic.  Eyes: Pupils are equal, round, and reactive to light. Right eye exhibits no discharge. Left eye exhibits no discharge.  Neck: Normal range of motion.  Musculoskeletal: Normal range of motion.  Lymphadenopathy:    She has no cervical adenopathy.  Neurological: She is alert and oriented to person, place, and time.  Skin: Skin is warm and dry. She is not diaphoretic. There is erythema.  Examination of the skin shows a 2 lesions over the upper thigh as described in the HPI.  Is a small amount of induration directly under the eschar.  The surrounding erythema measures approximately 1 cm on the anterior lesion and half centimeter on the posterior.  Refer to photographs  for detail  Psychiatric: She has a normal mood and affect. Her behavior is normal. Judgment and thought content normal.  Nursing note and vitals reviewed.        UC Treatments / Results  Labs (all labs ordered are listed, but only abnormal results are displayed) Labs Reviewed - No data to display  EKG None  Radiology No results found.  Procedures Procedures (including critical care time)  Medications Ordered in UC Medications - No data to display  Initial Impression / Assessment and Plan / UC Course  I have reviewed the triage vital signs and the nursing notes.  Pertinent labs & imaging results that were available during my care of the patient were reviewed by me and considered in my medical decision making (see chart for details).     Plan: 1. Test/x-ray results and diagnosis reviewed with patient 2. rx as per orders; risks, benefits, potential side effects reviewed with patient 3. Recommend supportive treatment with discontinue the use of the Neosporin ointment. Start  Using Bactroban ointment 3 times daily over both of the areas of induration.  Will start on doxycycline for 5 days to assist with the resolution.  Continues to have problems she should follow-up with her primary care physician or return to our clinic 4. F/u prn if symptoms worsen or don't improve  Final Clinical Impressions(s) / UC Diagnoses   Final diagnoses:  Insect bite of right thigh, initial encounter     Discharge Instructions     Apply mupirocin ointment 3 times daily.  Discontinue use of Neosporin.   ED Prescriptions    Medication Sig Dispense Auth. Provider   mupirocin ointment (BACTROBAN) 2 % Apply 1 application topically 3 (three) times daily. 22 g Ovid Curd P, PA-C   doxycycline (VIBRAMYCIN) 100 MG capsule Take 1 capsule (100 mg total) by mouth 2 (two) times daily. 10 capsule Lutricia Feil, PA-C     Controlled Substance Prescriptions [place] Controlled Substance Registry  consulted? Not Applicable   Lutricia Feil, PA-C 05/01/18 1315

## 2018-05-01 NOTE — Discharge Instructions (Signed)
Apply mupirocin ointment 3 times daily.  Discontinue use of Neosporin.

## 2018-08-17 ENCOUNTER — Other Ambulatory Visit: Payer: Self-pay | Admitting: Family Medicine

## 2018-08-17 DIAGNOSIS — Z1231 Encounter for screening mammogram for malignant neoplasm of breast: Secondary | ICD-10-CM

## 2018-08-27 ENCOUNTER — Ambulatory Visit: Payer: Medicaid Other | Admitting: Podiatry

## 2018-08-27 ENCOUNTER — Encounter: Payer: Self-pay | Admitting: Podiatry

## 2018-08-27 ENCOUNTER — Ambulatory Visit (INDEPENDENT_AMBULATORY_CARE_PROVIDER_SITE_OTHER): Payer: Medicaid Other

## 2018-08-27 DIAGNOSIS — M2022 Hallux rigidus, left foot: Secondary | ICD-10-CM

## 2018-08-27 DIAGNOSIS — M722 Plantar fascial fibromatosis: Secondary | ICD-10-CM

## 2018-08-27 DIAGNOSIS — L84 Corns and callosities: Secondary | ICD-10-CM | POA: Diagnosis not present

## 2018-08-27 DIAGNOSIS — M205X1 Other deformities of toe(s) (acquired), right foot: Secondary | ICD-10-CM

## 2018-08-27 DIAGNOSIS — Q828 Other specified congenital malformations of skin: Secondary | ICD-10-CM

## 2018-08-27 DIAGNOSIS — M2021 Hallux rigidus, right foot: Secondary | ICD-10-CM | POA: Diagnosis not present

## 2018-08-27 NOTE — Progress Notes (Signed)
This patient presents the office with multiple foot problems.  She says her primary pain is caused by calluses on the bottom of both feet.  She says she has painful calluses on the bottom of both feet and open fissures in the back of her left heel, especially.  She also has painful callus noted under both big toe.  She says the heel callus causes her to walk abnormally and causes severe cramping in both her legs and feet.   She says that she has treated her calluses with pedi-eggs emery board and scrapers with minimal improvement.  She also says she has having pain in both big toe joints both feet.  She says the bony prominence is causing her pain walking and wearing her shoes.  She also has a painful callus under the forefoot both feet.  She says she has been dealing with this foot problem for over 35 years. She presents the office today for an evaluation and treatment of her painful feet.     Vascular  Dorsalis pedis and posterior tibial pulses are palpable  B/L.  Capillary return  WNL.  Temperature gradient is  WNL.  Skin turgor  WNL  Sensorium  Senn Weinstein monofilament wire  WNL. Normal tactile sensation.  Nail Exam  Patient has normal nails with no evidence of bacterial or fungal infection.  Orthopedic  Exam  Muscle tone and muscle strength  WNL.  No limitations of motion feet  B/L.  No crepitus or joint effusion noted.  Foot type is unremarkable and digits show no abnormalities. Bony prominence first metatarsal  B/L.  Plantar flex 3rd metatarsal  B/L.  Skin  No open lesions.  Normal skin texture and turgor.  Pinch callus hallux  B/L with left greater than right.  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  Porokeratosis sub 3  B/L.  Hallux limitus first MPJ bilateral.  Porokeratosis, sub-3 bilateral.  Pinch callus bilateral.     IE  X-rays taken reveal the presence of joint narrowing big toe joint, bilateral.  Calcification at the Achilles tendon in heels bilaterally.   Discussed this condition with this patient.  Told this patient. I believe she has 2 problems.  Her first problem is her callus heels with fissures.  Told her to use paraffin bath as well as Chapstick to help with her callus heels.  Told her to pick up three-quarter Spenco orthoses to wearing her shoes.  I feel I can apply dispersion padding in the future when she returns.  She is to return to the office in 3 weeks for further evaluation and treatment.  RTC 3 weeks   Helane GuntherGregory Timberlynn Kizziah DPM

## 2018-09-24 ENCOUNTER — Encounter: Payer: Self-pay | Admitting: Podiatry

## 2018-09-24 ENCOUNTER — Ambulatory Visit: Payer: Medicaid Other | Admitting: Podiatry

## 2018-09-24 DIAGNOSIS — M2021 Hallux rigidus, right foot: Secondary | ICD-10-CM

## 2018-09-24 DIAGNOSIS — M205X1 Other deformities of toe(s) (acquired), right foot: Secondary | ICD-10-CM

## 2018-09-24 DIAGNOSIS — M2022 Hallux rigidus, left foot: Secondary | ICD-10-CM

## 2018-09-24 DIAGNOSIS — M722 Plantar fascial fibromatosis: Secondary | ICD-10-CM | POA: Diagnosis not present

## 2018-09-24 DIAGNOSIS — L608 Other nail disorders: Secondary | ICD-10-CM

## 2018-09-24 DIAGNOSIS — L84 Corns and callosities: Secondary | ICD-10-CM

## 2018-09-25 ENCOUNTER — Encounter: Payer: Self-pay | Admitting: Podiatry

## 2018-09-25 NOTE — Progress Notes (Signed)
This patient presents to the office for follow-up diagnosis of plantar fasciitis  on both feet and callus on the  back of her heels with her left heel worse.  Patient states that she has been using Chapstick in an effort to heal her heel fissures.  She says that they are closing and are less painful when she walks.  She says she has been only able to use the parafin bath four times.  She says that her arches are doing better  wearing the  power step insoles.   She says she has difficulty with  some shoes, but then realizes she has left the actual insoles from the shoes in when she wears the power step insoles.  She says that this is also improving.  She also relates she is developing pain in multiple toes on both feet. due to her nails.  She returns to the office today for an evaluation and treatment of her feet  General Appearance  Alert, conversant and in no acute stress.  Vascular  Dorsalis pedis and posterior tibial  pulses are palpable  bilaterally.  Capillary return is within normal limits  bilaterally. Temperature is within normal limits  bilaterally.  Neurologic  Senn-Weinstein monofilament wire test within normal limits  bilaterally. Muscle power within normal limits bilaterally.  Nails normal nails noted with mule toes exhibiting pincer toenail formation.  No evidence of any infection or drainage.  Orthopedic  No limitations of motion  feet .  No crepitus or effusions noted.  No bony pathology or digital deformities noted. Bony prominence first metatarsal bilateral.  Plantarflexed third metatarsal bilateral.  Skin  normotropic skin with  porokeratosis noted sub 3  bilaterally.  No signs of infections or ulcers noted.  Healing noted in the fissures especially her left heel.  Plantar fasciitis  B/L  FHL  B/L  Heel Callus  B/L  Pincer nails  B/L.  ROV.  Discuss the improvement in both her heel and her plantar fascia.  Told her to wear power step insoles without insoles from the shoes.   Continue using chapstick in her heel fissures. Discussed surgical correction of her painful pincer toenails in the future.  RTC prn.   Helane Gunther DPM

## 2019-02-06 ENCOUNTER — Other Ambulatory Visit: Payer: Self-pay

## 2019-02-06 ENCOUNTER — Emergency Department: Payer: Medicaid Other

## 2019-02-06 ENCOUNTER — Encounter: Payer: Self-pay | Admitting: Intensive Care

## 2019-02-06 ENCOUNTER — Emergency Department
Admission: EM | Admit: 2019-02-06 | Discharge: 2019-02-06 | Disposition: A | Discharge status: Home / Self Care | Payer: Medicaid Other | Attending: Emergency Medicine | Admitting: Emergency Medicine

## 2019-02-06 DIAGNOSIS — R1032 Left lower quadrant pain: Secondary | ICD-10-CM | POA: Diagnosis not present

## 2019-02-06 DIAGNOSIS — F17210 Nicotine dependence, cigarettes, uncomplicated: Secondary | ICD-10-CM | POA: Diagnosis not present

## 2019-02-06 DIAGNOSIS — R58 Hemorrhage, not elsewhere classified: Secondary | ICD-10-CM | POA: Diagnosis not present

## 2019-02-06 DIAGNOSIS — Z79899 Other long term (current) drug therapy: Secondary | ICD-10-CM | POA: Insufficient documentation

## 2019-02-06 DIAGNOSIS — D680 Von Willebrand's disease: Secondary | ICD-10-CM | POA: Insufficient documentation

## 2019-02-06 DIAGNOSIS — M7981 Nontraumatic hematoma of soft tissue: Secondary | ICD-10-CM

## 2019-02-06 DIAGNOSIS — I10 Essential (primary) hypertension: Secondary | ICD-10-CM | POA: Insufficient documentation

## 2019-02-06 DIAGNOSIS — R109 Unspecified abdominal pain: Secondary | ICD-10-CM | POA: Diagnosis present

## 2019-02-06 HISTORY — DX: Disorder of kidney and ureter, unspecified: N28.9

## 2019-02-06 LAB — URINALYSIS, COMPLETE (UACMP) WITH MICROSCOPIC
Bacteria, UA: NONE SEEN
Bilirubin Urine: NEGATIVE
GLUCOSE, UA: 50 mg/dL — AB
Hgb urine dipstick: NEGATIVE
Ketones, ur: NEGATIVE mg/dL
Leukocytes,Ua: NEGATIVE
Nitrite: NEGATIVE
Protein, ur: NEGATIVE mg/dL
Specific Gravity, Urine: 1.042 — ABNORMAL HIGH (ref 1.005–1.030)
pH: 7 (ref 5.0–8.0)

## 2019-02-06 LAB — COMPREHENSIVE METABOLIC PANEL
ALK PHOS: 105 U/L (ref 38–126)
ALT: 25 U/L (ref 0–44)
ANION GAP: 7 (ref 5–15)
AST: 22 U/L (ref 15–41)
Albumin: 4 g/dL (ref 3.5–5.0)
BUN: 10 mg/dL (ref 6–20)
CALCIUM: 8.8 mg/dL — AB (ref 8.9–10.3)
CO2: 27 mmol/L (ref 22–32)
Chloride: 105 mmol/L (ref 98–111)
Creatinine, Ser: 0.75 mg/dL (ref 0.44–1.00)
GFR calc Af Amer: >60 mL/min (ref >60)
GFR calc non Af Amer: >60 mL/min (ref >60)
Glucose, Bld: 154 mg/dL — ABNORMAL HIGH (ref 70–99)
Potassium: 4.1 mmol/L (ref 3.5–5.1)
Sodium: 139 mmol/L (ref 135–145)
TOTAL PROTEIN: 7.1 g/dL (ref 6.5–8.1)
Total Bilirubin: 0.3 mg/dL (ref 0.3–1.2)

## 2019-02-06 LAB — CBC
HCT: 44.6 % (ref 36.0–46.0)
Hemoglobin: 14.8 g/dL (ref 12.0–15.0)
MCH: 32.5 pg (ref 26.0–34.0)
MCHC: 33.2 g/dL (ref 30.0–36.0)
MCV: 97.8 fL (ref 80.0–100.0)
PLATELETS: 213 10*3/uL (ref 150–400)
RBC: 4.56 MIL/uL (ref 3.87–5.11)
RDW: 12.9 % (ref 11.5–15.5)
WBC: 9 10*3/uL (ref 4.0–10.5)
nRBC: 0 % (ref 0.0–0.2)

## 2019-02-06 LAB — LIPASE, BLOOD: Lipase: 26 U/L (ref 11–51)

## 2019-02-06 MED ORDER — TRAMADOL HCL 50 MG PO TABS
50.0000 mg | ORAL_TABLET | Freq: Once | ORAL | Status: AC
  Administered 2019-02-06: 50 mg via ORAL
  Filled 2019-02-06: qty 1

## 2019-02-06 MED ORDER — SODIUM CHLORIDE 0.9 % IV SOLN
20.0000 ug | Freq: Once | INTRAVENOUS | Status: AC
  Administered 2019-02-06: 20 ug via INTRAVENOUS
  Filled 2019-02-06: qty 5

## 2019-02-06 MED ORDER — TRAMADOL HCL 50 MG PO TABS: 50.0000 mg | ORAL_TABLET | Freq: Four times a day (QID) | ORAL | 0 refills | Status: AC | PRN

## 2019-02-06 MED ORDER — IOHEXOL 300 MG/ML  SOLN
100.0000 mL | Freq: Once | INTRAMUSCULAR | Status: AC | PRN
  Administered 2019-02-06: 100 mL via INTRAVENOUS

## 2019-02-06 NOTE — Discharge Instructions (Signed)
Follow-up with your doctor in the next 2 days for repeat assessment of your symptoms and recheck of your blood counts.  Return to the ER if you are feeling much worse or getting dizzy and passing out.

## 2019-02-06 NOTE — ED Triage Notes (Addendum)
Patient is here for sharp LLQ pain. Patient reports she saw PCP 01/31/19 for sinus congestion and cough. She c/o LLQ pain from coughing that had been aching but around 0300 this morning she reports coughing and felt a sharp pain in LLQ that has not gone away. A&O x4 in triage. Patient c/o urine leakage when coughing or sneezing. Last saw urologist X2 years ago. Reports she is on antibiotic for congestion/cough

## 2019-02-06 NOTE — ED Notes (Signed)
Paper copy of discharge signed 

## 2019-02-06 NOTE — ED Provider Notes (Signed)
[facility] Emergency Department Provider Note  ____________________________________________  Time seen: Approximately 2:39 PM  I have reviewed the triage vital signs and the nursing notes.   HISTORY  Chief Complaint Abdominal Pain    HPI Beth Ayala is a 53 y.o. female with a history of hypertension and von Willebrand disease who complains of left lower quadrant abdominal pain that started 2 days ago, sharp, worse with movement and coughing.  No vomiting or diarrhea.  No constipation.  No fevers or chills.  No alleviating factors, nonradiating.      Past Medical History:  Diagnosis Date  . High cholesterol   . Hypertension   . Renal disorder   . Von Willebrand disease (HCC)      There are no active problems to display for this patient.    Past Surgical History:  Procedure Laterality Date  . ABDOMINAL HYSTERECTOMY    . BLADDER SURGERY    . CARPAL TUNNEL RELEASE    . ECTOPIC PREGNANCY SURGERY       Prior to Admission medications   Medication Sig Start Date End Date Taking? Authorizing Provider  benazepril (LOTENSIN) 20 MG tablet Take 20 mg by mouth daily. 05/28/18   [provider]  lisinopril (PRINIVIL,ZESTRIL) 20 MG tablet Take by mouth.    [provider]  Multiple Vitamin (MULTI-VITAMINS) TABS Take by mouth.    [provider]  mupirocin ointment (BACTROBAN) 2 % Apply 1 application topically 3 (three) times daily. 05/01/18   Lutricia Feil, PA-C  omeprazole (PRILOSEC) 40 MG capsule Take by mouth. 04/21/18 04/22/19  [provider]  oxybutynin (DITROPAN) 5 MG tablet Take 5 mg by mouth 2 (two) times daily. 09/03/18   [provider]  oxybutynin (DITROPAN-XL) 10 MG 24 hr tablet Take by mouth.    [provider]  simvastatin (ZOCOR) 80 MG tablet Take 80 mg by mouth at bedtime. 06/27/18   [provider]  traMADol (ULTRAM) 50 MG tablet Take 1 tablet (50 mg total) by mouth  every 6 (six) hours as needed. 02/06/19   Sharman Cheek, MD  traZODone (DESYREL) 50 MG tablet Take by mouth. 02/16/18   [provider]  venlafaxine XR (EFFEXOR-XR) 75 MG 24 hr capsule Take by mouth. 02/16/18 02/16/19  [provider]     Allergies Aspirin and Nsaids   History reviewed. No pertinent family history.  Social History Social History   Tobacco Use  . Smoking status: Current Every Day Smoker    Packs/day: 0.50    Types: Cigarettes  . Smokeless tobacco: Never Used  Substance Use Topics  . Alcohol use: Never    Frequency: Never  . Drug use: Never    Review of Systems  Constitutional:   No fever or chills.  ENT:   No sore throat. No rhinorrhea. Cardiovascular:   No chest pain or syncope. Respiratory:   No dyspnea or cough. Gastrointestinal: Positive as above abdominal pain without vomiting and diarrhea.  Musculoskeletal:   Negative for focal pain or swelling All other systems reviewed and are negative except as documented above in ROS and HPI.  ____________________________________________   PHYSICAL EXAM:  VITAL SIGNS: ED Triage Vitals  Enc Vitals Group     BP 02/06/19 0916 (!) 141/82     Pulse Rate 02/06/19 0916 91     Resp 02/06/19 0916 16     Temp 02/06/19 0916 99.2 F (37.3 C)     Temp Source 02/06/19 0916 Oral  SpO2 02/06/19 0916 96 %     Weight 02/06/19 0917 180 lb (81.6 kg)     Height 02/06/19 0917 5' (1.524 m)     Head Circumference --      Peak Flow --      Pain Score 02/06/19 0916 10     Pain Loc --      Pain Edu? --      Excl. in GC? --     Vital signs reviewed, nursing assessments reviewed.   Constitutional:   Alert and oriented. Non-toxic appearance. Eyes:   Conjunctivae are normal. EOMI. PERRL. ENT      Head:   Normocephalic and atraumatic.      Nose:   No congestion/rhinnorhea.       Mouth/Throat:   MMM, no pharyngeal erythema. No peritonsillar mass.       Neck:   No meningismus. Full  ROM. Hematological/Lymphatic/Immunilogical:   No cervical lymphadenopathy. Cardiovascular:   RRR. Symmetric bilateral radial and DP pulses.  No murmurs. Cap refill less than 2 seconds. Respiratory:   Normal respiratory effort without tachypnea/retractions. Breath sounds are clear and equal bilaterally. No wheezes/rales/rhonchi. Gastrointestinal:   Soft with focal left lower quadrant tenderness.  No hernias, but there is a small palpable mass at the left lower abdominal wall. Non distended. There is no CVA tenderness.  No rebound, rigidity, or guarding. Musculoskeletal:   Normal range of motion in all extremities. No joint effusions.  No lower extremity tenderness.  No edema. Neurologic:   Normal speech and language.  Motor grossly intact. No acute focal neurologic deficits are appreciated.  Skin:    Skin is warm, dry and intact. No rash noted.  No petechiae, purpura, or bullae.  ____________________________________________    LABS (pertinent positives/negatives) (all labs ordered are listed, but only abnormal results are displayed) Labs Reviewed  COMPREHENSIVE METABOLIC PANEL - Abnormal; Notable for the following components:      Result Value   Glucose, Bld 154 (*)    Calcium 8.8 (*)    All other components within normal limits  URINALYSIS, COMPLETE (UACMP) WITH MICROSCOPIC - Abnormal; Notable for the following components:   Color, Urine STRAW (*)    APPearance CLEAR (*)    Specific Gravity, Urine 1.042 (*)    Glucose, UA 50 (*)    All other components within normal limits  LIPASE, BLOOD  CBC   ____________________________________________   EKG    ____________________________________________    RADIOLOGY  Ct Abdomen Pelvis W Contrast  Result Date: 02/06/2019 CLINICAL DATA:  Abdominal pain, acute, generalized. Question left lower quadrant hernia. EXAM: CT ABDOMEN AND PELVIS WITH CONTRAST TECHNIQUE: Multidetector CT imaging of the abdomen and pelvis was performed using the  standard protocol following bolus administration of intravenous contrast. CONTRAST:  OMNIPAQUE IOHEXOL 300 MG/ML  SOLN COMPARISON:  None. FINDINGS: Lower chest:  No contributory findings. Hepatobiliary: Hepatic steatosis.no evidence of biliary obstruction or stone. Pancreas: Unremarkable. Spleen: Unremarkable. Adrenals/Urinary Tract: Negative adrenals. No hydronephrosis or stone. Unremarkable bladder. Stomach/Bowel:  No obstruction. No appendicitis. Vascular/Lymphatic: No acute vascular abnormality. Atherosclerotic calcification of the aorta and iliacs, prominent for age. No mass or adenopathy. Reproductive:Hysterectomy.  Unremarkable ovaries Other: No ascites or pneumoperitoneum. Musculoskeletal: The lower left rectus abdominus is thickened by ill-defined high-density with subjacent fat stranding. Given the acuity of symptoms this is most consistent with spontaneous rectus sheath hematoma. Hematoma is indistinct enough that measurement is difficult, but the hematoma is at least 3 x 4 cm. On sagittal reformats  there is a punctate internal high-density area, can not exclude ongoing hemorrhage. These results were called by telephone at the time of interpretation on 02/06/2019 at 11:07 am to Dr. Sharman Cheek , who verbally acknowledged these results. IMPRESSION: 1. Left rectus sheath hematoma measuring at least 4 cm. On reformats there is a tiny superimposed high-density area and there may be ongoing bleeding. 2. Hepatic steatosis. 3. Prominent for age aortic atherosclerosis. Electronically Signed   By: Marnee Spring M.D.   On: 02/06/2019 11:08    ____________________________________________   PROCEDURES Procedures  ____________________________________________  DIFFERENTIAL DIAGNOSIS   Abdominal wall hernia, diverticulitis, cystitis, pyelonephritis  CLINICAL IMPRESSION / ASSESSMENT AND PLAN / ED COURSE  Medications ordered in the ED: Medications  desmopressin (DDAVP) 20 mcg in sodium  chloride 0.9 % 50 mL IVPB (20 mcg Intravenous New Bag/Given 02/06/19 1439)  iohexol (OMNIPAQUE) 300 MG/ML solution 100 mL (100 mLs Intravenous Contrast Given 02/06/19 1030)  traMADol (ULTRAM) tablet 50 mg (50 mg Oral Given 02/06/19 1438)    Pertinent labs & imaging results that were available during my care of the patient were reviewed by me and considered in my medical decision making (see chart for details).    Patient presents with acute left lower quadrant abdominal pain.  Small palpable mass in the left lower quadrant abdominal wall concerning for hernia, although I doubt a strangulated loop of bowel.  CT scan obtained which shows a small rectus sheath hematoma.  Patient reports that she is DDAVP responsive and has a hematologist at [facility].  I will give a dose of DDAVP today.  Vital signs are normal, hemoglobin is 14, symptoms are subacute.  She is on Omnicef and azithromycin from primary care for a clinically diagnosed community-acquired pneumonia.  Breathing is nonlabored and stable.  She is not septic.  Suitable for outpatient follow-up.  Recommended she call her hematologist to schedule a follow-up appointment in 2 days for recheck of CBC.  Return precautions for worsening symptoms.       ____________________________________________   FINAL CLINICAL IMPRESSION(S) / ED DIAGNOSES    Final diagnoses:  Left lower quadrant abdominal pain  Nontraumatic rectus hematoma     ED Discharge Orders         Ordered    traMADol (ULTRAM) 50 MG tablet  Every 6 hours PRN     02/06/19 1439          Portions of this note were generated with dragon dictation software. Dictation errors may occur despite best attempts at proofreading.   Sharman Cheek, MD 02/06/19 1450

## 2019-02-06 NOTE — ED Notes (Signed)
Pt given soda and sandwich tray Awaiting medication from pharmacy

## 2019-02-06 NOTE — ED Notes (Signed)
Pt given Sprite to drink. 

## 2019-10-16 ENCOUNTER — Other Ambulatory Visit: Payer: Self-pay

## 2019-10-16 DIAGNOSIS — Z20822 Contact with and (suspected) exposure to covid-19: Secondary | ICD-10-CM

## 2019-10-18 LAB — NOVEL CORONAVIRUS, NAA: SARS-CoV-2, NAA: NOT DETECTED

## 2019-12-02 ENCOUNTER — Ambulatory Visit: Payer: MEDICAID | Attending: Internal Medicine

## 2019-12-02 DIAGNOSIS — Z20822 Contact with and (suspected) exposure to covid-19: Secondary | ICD-10-CM

## 2019-12-04 LAB — NOVEL CORONAVIRUS, NAA: SARS-CoV-2, NAA: NOT DETECTED

## 2019-12-30 ENCOUNTER — Other Ambulatory Visit: Payer: Self-pay

## 2019-12-30 DIAGNOSIS — Z20822 Contact with and (suspected) exposure to covid-19: Secondary | ICD-10-CM

## 2019-12-31 LAB — NOVEL CORONAVIRUS, NAA: SARS-CoV-2, NAA: NOT DETECTED

## 2020-02-14 ENCOUNTER — Ambulatory Visit: Payer: Self-pay

## 2020-02-24 ENCOUNTER — Ambulatory Visit: Payer: MEDICAID | Attending: Internal Medicine

## 2020-02-24 DIAGNOSIS — Z23 Encounter for immunization: Secondary | ICD-10-CM

## 2020-02-24 NOTE — Progress Notes (Signed)
   Covid-19 Vaccination Clinic  Name:  Beth Ayala    MRN: [id number] DOB: 07-23-1966  02/24/2020  Beth Ayala was observed post Covid-19 immunization for 15 minutes without incident. She was provided with Vaccine Information Sheet and instruction to access the V-Safe system.   Beth Ayala was instructed to call 911 with any severe reactions post vaccine: Marland Kitchen Difficulty breathing  . Swelling of face and throat  . A fast heartbeat  . A bad rash all over body  . Dizziness and weakness   Immunizations Administered    Name Date Dose VIS Date Route   Pfizer COVID-19 Vaccine 02/24/2020 11:16 AM 0.3 mL 11/15/2019 Intramuscular   Manufacturer: [employer], [employer]   Lot: [id number]   NDC: [address]-4

## 2020-03-24 ENCOUNTER — Ambulatory Visit: Payer: MEDICAID | Attending: Internal Medicine

## 2020-03-24 DIAGNOSIS — Z23 Encounter for immunization: Secondary | ICD-10-CM

## 2020-03-24 NOTE — Progress Notes (Signed)
   Covid-19 Vaccination Clinic  Name:  Will Schier    MRN: [id number] DOB: May 03, 1966  03/24/2020  Ms. Chapa was observed post Covid-19 immunization for 30 minutes based on pre-vaccination screening without incident. She was provided with Vaccine Information Sheet and instruction to access the V-Safe system.   Ms. Shull was instructed to call 911 with any severe reactions post vaccine: Marland Kitchen Difficulty breathing  . Swelling of face and throat  . A fast heartbeat  . A bad rash all over body  . Dizziness and weakness   Immunizations Administered    Name Date Dose VIS Date Route   Pfizer COVID-19 Vaccine 03/24/2020  1:43 PM 0.3 mL 01/29/2019 Intramuscular   Manufacturer: [employer], [employer]   Lot: [id number]   NDC: [address]-3

## 2020-07-29 ENCOUNTER — Other Ambulatory Visit: Payer: Self-pay

## 2020-07-29 DIAGNOSIS — Z20822 Contact with and (suspected) exposure to covid-19: Secondary | ICD-10-CM

## 2020-07-31 LAB — NOVEL CORONAVIRUS, NAA: SARS-CoV-2, NAA: NOT DETECTED

## 2020-07-31 LAB — SARS-COV-2, NAA 2 DAY TAT

## 2020-12-13 IMAGING — CT CT ABD-PELV W/ CM
2 of 5 series · 16 of 46 positions shown, 18 images · IV contrast (APPLIED)
Comparison: None.

CLINICAL DATA: Abdominal pain, acute, generalized. Question left
lower quadrant hernia.

EXAM:
CT ABDOMEN AND PELVIS WITH CONTRAST
TECHNIQUE: Multidetector CT imaging of the abdomen and pelvis was performed
using the standard protocol following bolus administration of
intravenous contrast.
CONTRAST:  100mL OMNIPAQUE IOHEXOL 300 MG/ML  SOLN

[Series 2: routine abd/pel with · axial · 0.73mm/px · z∈[-397,-22]mm · 13 of 85 slices shown, 15 images]
[im 5/85  soft-tissue]
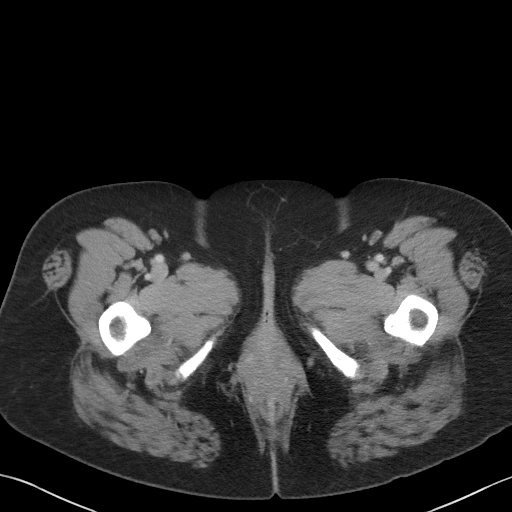
[im 5/85  bone]
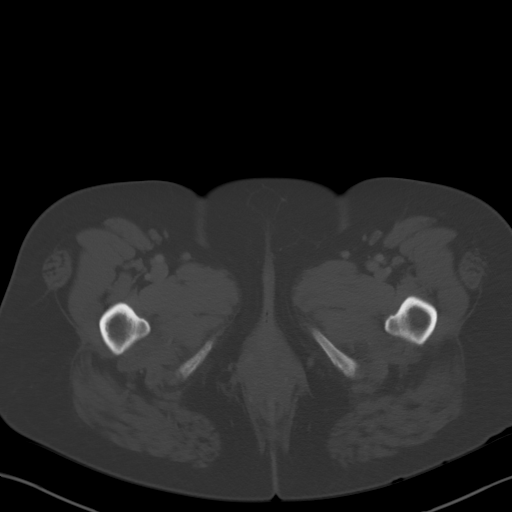
[im 14/85  soft-tissue]
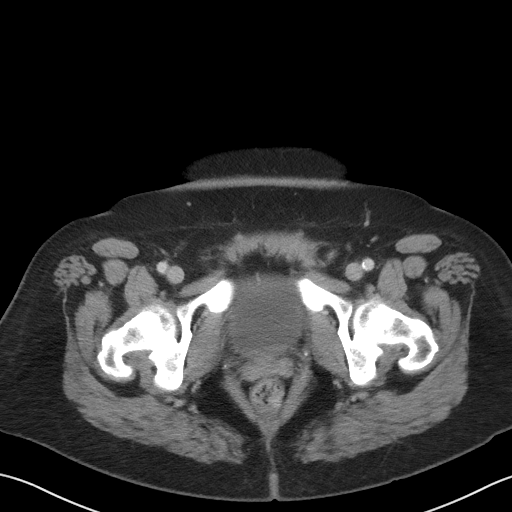
[im 18/85  soft-tissue]
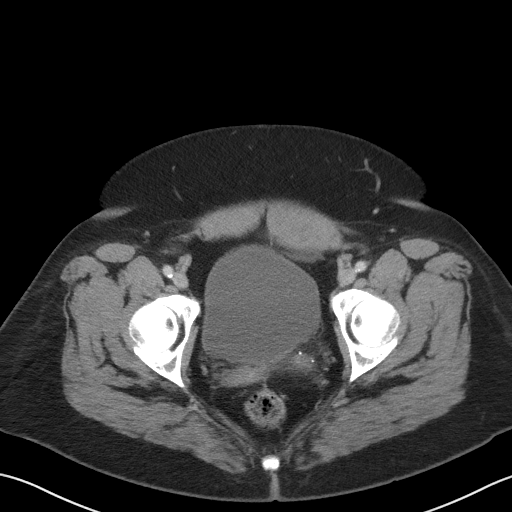
[im 23/85  soft-tissue]
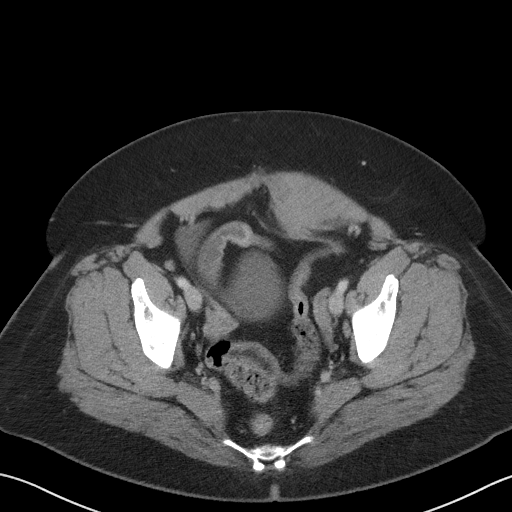
[im 31/85  soft-tissue]
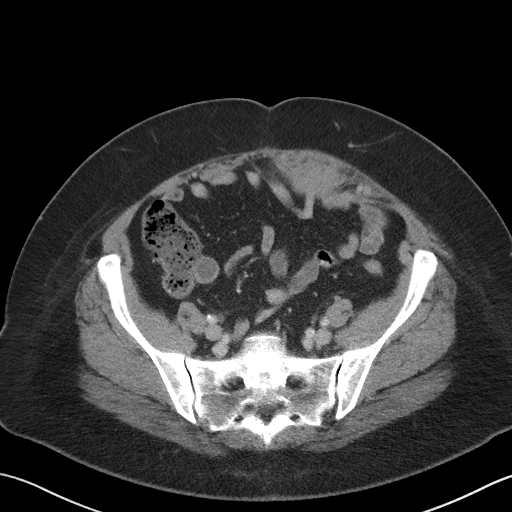
[im 36/85  soft-tissue]
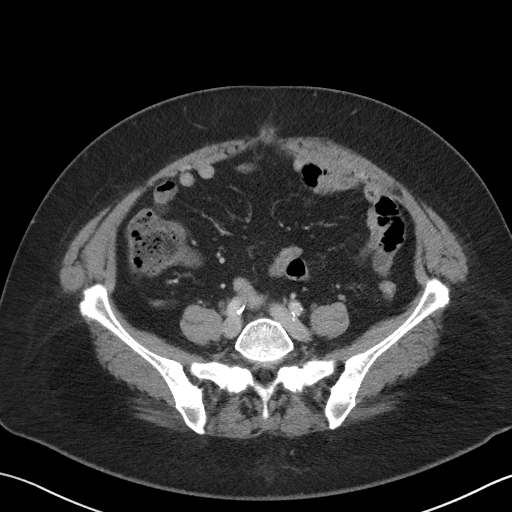
[im 45/85  soft-tissue]
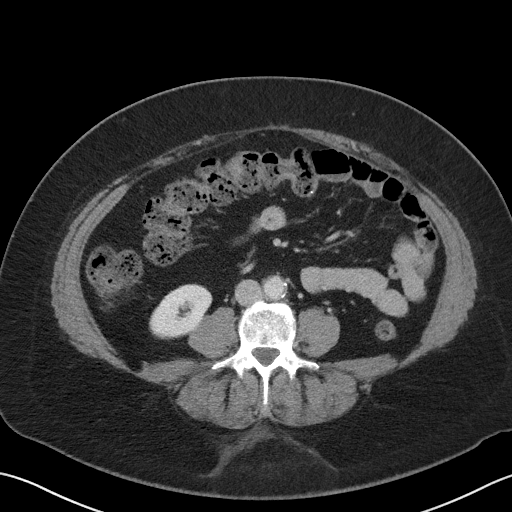
[im 49/85  soft-tissue]
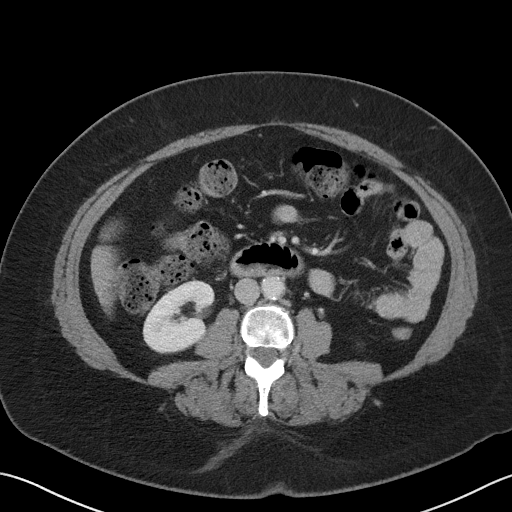
[im 54/85  soft-tissue]
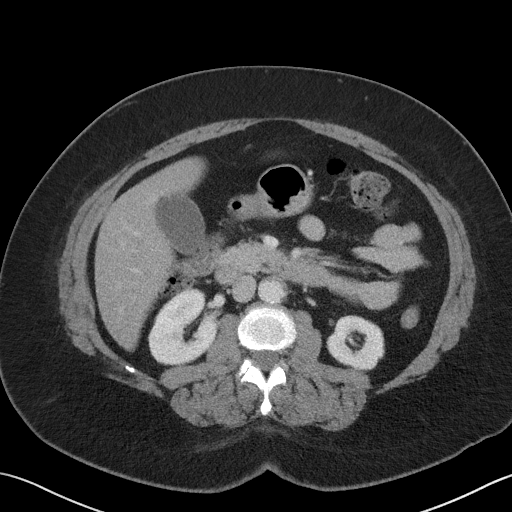
[im 54/85  bone]
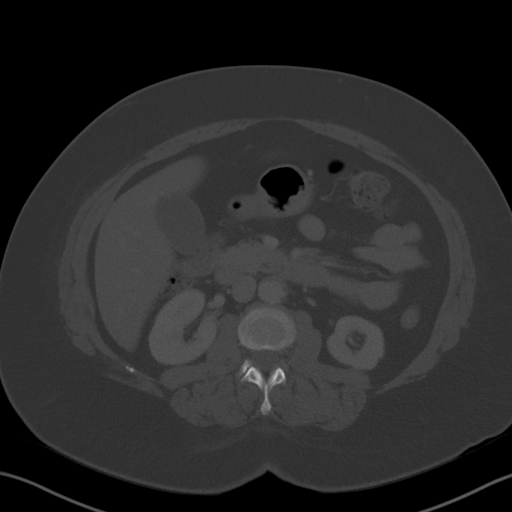
[im 62/85  soft-tissue]
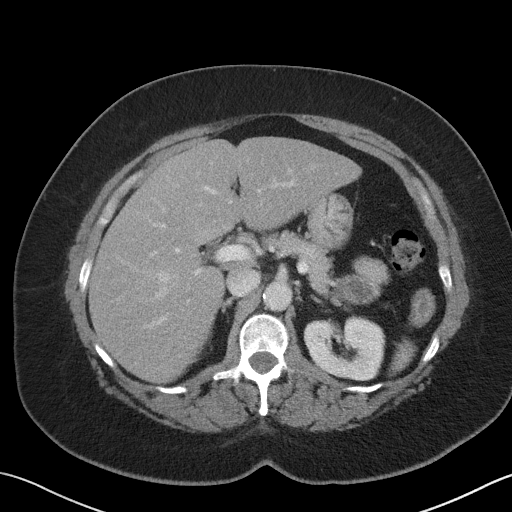
[im 67/85  soft-tissue]
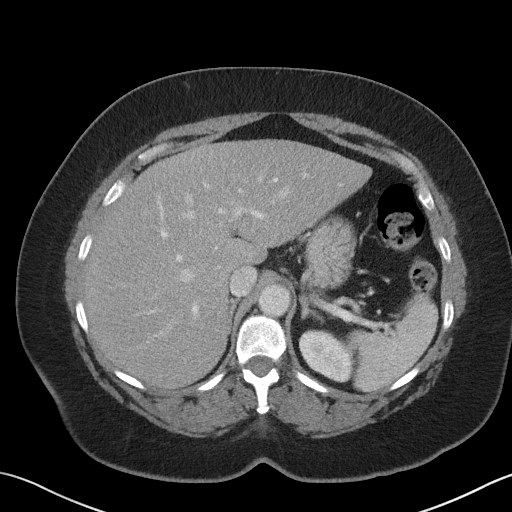
[im 71/85  soft-tissue]
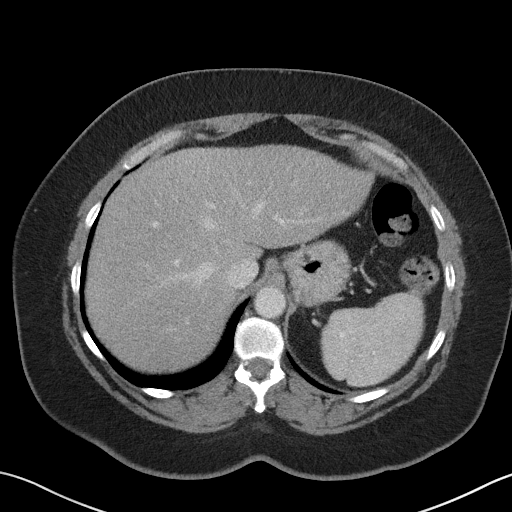
[im 80/85  soft-tissue]
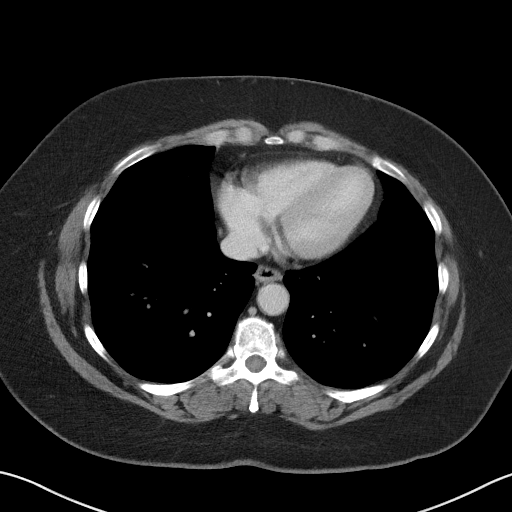

[Series 5: coronal st · coronal · 0.77mm/px · 3 of 106 slices shown]
[im 36/106  soft-tissue]
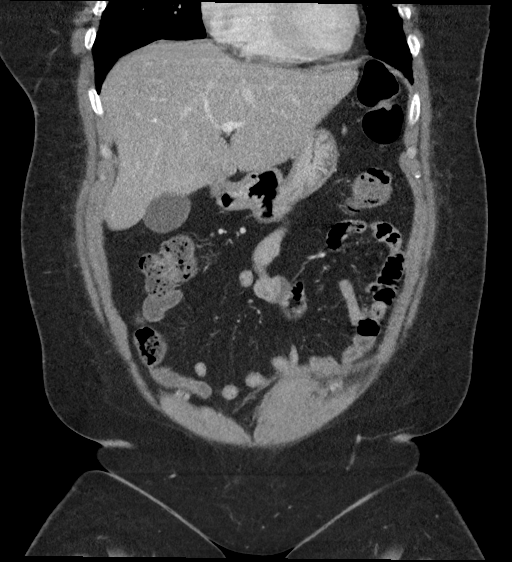
[im 47/106  soft-tissue]
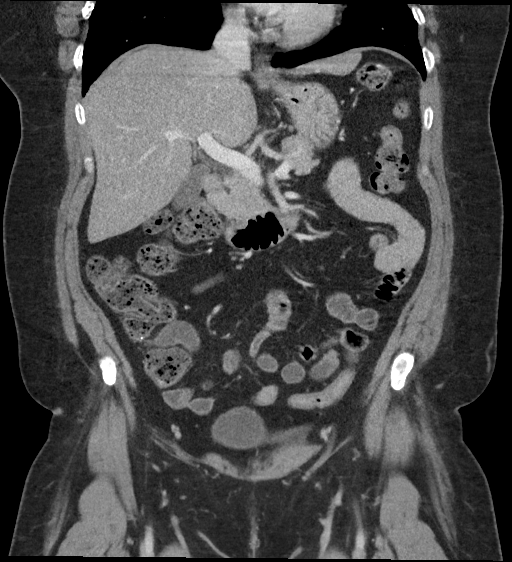
[im 59/106  soft-tissue]
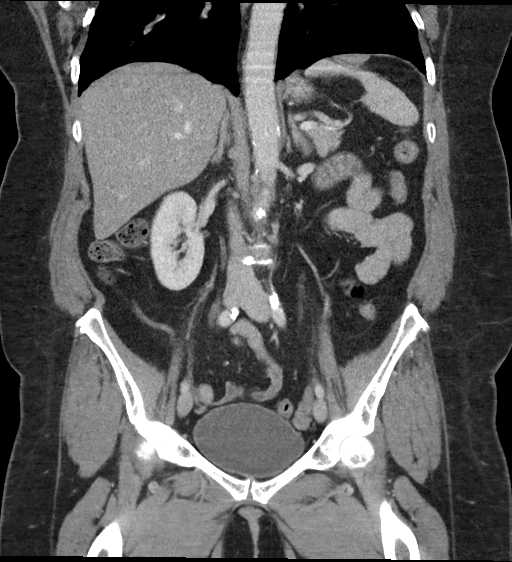

[16 of 46 positions shown; findings below may reference images not displayed]

FINDINGS: Lower chest:  No contributory findings.

Hepatobiliary: Hepatic steatosis.no evidence of biliary obstruction
or stone.

Pancreas: Unremarkable.

Spleen: Unremarkable.

Adrenals/Urinary Tract: Negative adrenals. No hydronephrosis or
stone. Unremarkable bladder.

Stomach/Bowel:  No obstruction. No appendicitis.

Vascular/Lymphatic: No acute vascular abnormality. Atherosclerotic
calcification of the aorta and iliacs, prominent for age. No mass or
adenopathy.

Reproductive:Hysterectomy.  Unremarkable ovaries

Other: No ascites or pneumoperitoneum.

Musculoskeletal: The lower left rectus abdominus is thickened by
ill-defined high-density with subjacent fat stranding. Given the
acuity of symptoms this is most consistent with spontaneous rectus
sheath hematoma. Hematoma is indistinct enough that measurement is
difficult, but the hematoma is at least 3 x 4 cm. On sagittal
reformats there is a punctate internal high-density area, can not
exclude ongoing hemorrhage.

These results were called by telephone at the time of interpretation
on 02/06/2019 at [DATE] to Dr. XILDHIBAN TOMAND JERRY , who verbally
acknowledged these results.
IMPRESSION: 1. Left rectus sheath hematoma measuring at least 4 cm. On reformats
there is a tiny superimposed high-density area and there may be
ongoing bleeding.
2. Hepatic steatosis.
3. Prominent for age aortic atherosclerosis.

## 2021-02-12 ENCOUNTER — Ambulatory Visit (LOCAL_COMMUNITY_HEALTH_CENTER): Payer: Medicaid Other

## 2021-02-12 ENCOUNTER — Other Ambulatory Visit: Payer: Self-pay

## 2021-02-12 DIAGNOSIS — Z111 Encounter for screening for respiratory tuberculosis: Secondary | ICD-10-CM

## 2021-02-15 ENCOUNTER — Ambulatory Visit (LOCAL_COMMUNITY_HEALTH_CENTER): Payer: Medicaid Other

## 2021-02-15 ENCOUNTER — Other Ambulatory Visit: Payer: Self-pay

## 2021-02-15 DIAGNOSIS — Z111 Encounter for screening for respiratory tuberculosis: Secondary | ICD-10-CM

## 2021-02-15 LAB — TB SKIN TEST
Induration: 0 mm
TB Skin Test: NEGATIVE
